# Patient Record
Sex: Male | Born: 1945 | Hispanic: No | Marital: Single | Smoking: Former smoker
Health system: Southern US, Community
[De-identification: ages and names within clinical notes are randomized; demographics above are authoritative.]

## PROBLEM LIST (undated history)

## (undated) DIAGNOSIS — I499 Cardiac arrhythmia, unspecified: Secondary | ICD-10-CM

## (undated) DIAGNOSIS — I2510 Atherosclerotic heart disease of native coronary artery without angina pectoris: Secondary | ICD-10-CM

## (undated) DIAGNOSIS — J189 Pneumonia, unspecified organism: Secondary | ICD-10-CM

## (undated) DIAGNOSIS — J40 Bronchitis, not specified as acute or chronic: Secondary | ICD-10-CM

## (undated) DIAGNOSIS — B029 Zoster without complications: Secondary | ICD-10-CM

## (undated) DIAGNOSIS — I422 Other hypertrophic cardiomyopathy: Secondary | ICD-10-CM

## (undated) DIAGNOSIS — E785 Hyperlipidemia, unspecified: Secondary | ICD-10-CM

## (undated) DIAGNOSIS — I10 Essential (primary) hypertension: Secondary | ICD-10-CM

## (undated) DIAGNOSIS — K219 Gastro-esophageal reflux disease without esophagitis: Secondary | ICD-10-CM

## (undated) HISTORY — DX: Zoster without complications: B02.9

## (undated) HISTORY — DX: Gastro-esophageal reflux disease without esophagitis: K21.9

## (undated) HISTORY — DX: Pneumonia, unspecified organism: J18.9

## (undated) HISTORY — DX: Bronchitis, not specified as acute or chronic: J40

---

## 1980-01-08 HISTORY — PX: APPENDECTOMY: SHX54

## 1997-08-22 ENCOUNTER — Encounter: Admission: RE | Admit: 1997-08-22 | Discharge: 1997-11-20 | Payer: Self-pay | Admitting: Family Medicine

## 1997-09-05 ENCOUNTER — Ambulatory Visit (HOSPITAL_COMMUNITY): Admission: RE | Admit: 1997-09-05 | Discharge: 1997-09-05 | Payer: Self-pay | Admitting: Interventional Cardiology

## 2001-03-17 ENCOUNTER — Other Ambulatory Visit: Admission: RE | Admit: 2001-03-17 | Discharge: 2001-03-17 | Payer: Self-pay | Admitting: Urology

## 2002-01-28 ENCOUNTER — Encounter: Admission: RE | Admit: 2002-01-28 | Discharge: 2002-01-28 | Payer: Self-pay | Admitting: Family Medicine

## 2002-01-28 ENCOUNTER — Encounter: Payer: Self-pay | Admitting: Family Medicine

## 2005-05-08 ENCOUNTER — Encounter: Payer: Self-pay | Admitting: Interventional Cardiology

## 2007-07-21 ENCOUNTER — Emergency Department (HOSPITAL_COMMUNITY): Admission: EM | Admit: 2007-07-21 | Discharge: 2007-07-21 | Payer: Self-pay | Admitting: Emergency Medicine

## 2007-10-23 ENCOUNTER — Inpatient Hospital Stay (HOSPITAL_COMMUNITY): Admission: EM | Admit: 2007-10-23 | Discharge: 2007-10-26 | Payer: Self-pay | Admitting: Emergency Medicine

## 2007-10-23 ENCOUNTER — Ambulatory Visit: Payer: Self-pay | Admitting: Internal Medicine

## 2007-10-26 ENCOUNTER — Encounter (INDEPENDENT_AMBULATORY_CARE_PROVIDER_SITE_OTHER): Payer: Self-pay | Admitting: *Deleted

## 2007-11-10 ENCOUNTER — Ambulatory Visit: Payer: Self-pay | Admitting: Internal Medicine

## 2007-11-10 ENCOUNTER — Telehealth (INDEPENDENT_AMBULATORY_CARE_PROVIDER_SITE_OTHER): Payer: Self-pay | Admitting: *Deleted

## 2007-11-10 DIAGNOSIS — I10 Essential (primary) hypertension: Secondary | ICD-10-CM | POA: Insufficient documentation

## 2007-11-10 DIAGNOSIS — J209 Acute bronchitis, unspecified: Secondary | ICD-10-CM

## 2007-11-12 ENCOUNTER — Telehealth (INDEPENDENT_AMBULATORY_CARE_PROVIDER_SITE_OTHER): Payer: Self-pay | Admitting: *Deleted

## 2007-11-12 ENCOUNTER — Emergency Department (HOSPITAL_COMMUNITY): Admission: EM | Admit: 2007-11-12 | Discharge: 2007-11-12 | Payer: Self-pay | Admitting: Emergency Medicine

## 2007-11-12 DIAGNOSIS — J984 Other disorders of lung: Secondary | ICD-10-CM | POA: Insufficient documentation

## 2007-11-13 ENCOUNTER — Telehealth (INDEPENDENT_AMBULATORY_CARE_PROVIDER_SITE_OTHER): Payer: Self-pay | Admitting: *Deleted

## 2007-11-16 ENCOUNTER — Ambulatory Visit: Payer: Self-pay | Admitting: Internal Medicine

## 2007-11-25 ENCOUNTER — Telehealth: Payer: Self-pay | Admitting: Internal Medicine

## 2007-12-11 ENCOUNTER — Telehealth (INDEPENDENT_AMBULATORY_CARE_PROVIDER_SITE_OTHER): Payer: Self-pay | Admitting: *Deleted

## 2007-12-17 ENCOUNTER — Telehealth (INDEPENDENT_AMBULATORY_CARE_PROVIDER_SITE_OTHER): Payer: Self-pay | Admitting: *Deleted

## 2008-01-04 ENCOUNTER — Telehealth: Payer: Self-pay | Admitting: Internal Medicine

## 2008-01-15 ENCOUNTER — Ambulatory Visit: Payer: Self-pay | Admitting: Internal Medicine

## 2008-01-15 DIAGNOSIS — R079 Chest pain, unspecified: Secondary | ICD-10-CM

## 2008-01-20 ENCOUNTER — Ambulatory Visit: Payer: Self-pay | Admitting: Cardiology

## 2008-01-25 ENCOUNTER — Telehealth (INDEPENDENT_AMBULATORY_CARE_PROVIDER_SITE_OTHER): Payer: Self-pay | Admitting: *Deleted

## 2008-01-27 ENCOUNTER — Ambulatory Visit: Payer: Self-pay

## 2008-02-09 ENCOUNTER — Telehealth (INDEPENDENT_AMBULATORY_CARE_PROVIDER_SITE_OTHER): Payer: Self-pay | Admitting: *Deleted

## 2008-02-11 ENCOUNTER — Telehealth (INDEPENDENT_AMBULATORY_CARE_PROVIDER_SITE_OTHER): Payer: Self-pay | Admitting: *Deleted

## 2008-02-12 ENCOUNTER — Ambulatory Visit: Payer: Self-pay | Admitting: Cardiology

## 2008-02-22 ENCOUNTER — Ambulatory Visit: Payer: Self-pay | Admitting: Internal Medicine

## 2008-02-23 ENCOUNTER — Telehealth: Payer: Self-pay | Admitting: Internal Medicine

## 2008-03-01 ENCOUNTER — Telehealth: Payer: Self-pay | Admitting: Internal Medicine

## 2008-03-06 DIAGNOSIS — K219 Gastro-esophageal reflux disease without esophagitis: Secondary | ICD-10-CM

## 2008-03-24 ENCOUNTER — Telehealth: Payer: Self-pay | Admitting: Internal Medicine

## 2008-03-31 ENCOUNTER — Telehealth: Payer: Self-pay | Admitting: Internal Medicine

## 2008-04-04 ENCOUNTER — Telehealth (INDEPENDENT_AMBULATORY_CARE_PROVIDER_SITE_OTHER): Payer: Self-pay | Admitting: *Deleted

## 2008-10-05 ENCOUNTER — Encounter: Admission: RE | Admit: 2008-10-05 | Discharge: 2008-10-05 | Payer: Self-pay | Admitting: Neurology

## 2008-10-11 ENCOUNTER — Ambulatory Visit: Payer: Self-pay | Admitting: Internal Medicine

## 2009-03-18 ENCOUNTER — Emergency Department (HOSPITAL_BASED_OUTPATIENT_CLINIC_OR_DEPARTMENT_OTHER): Admission: EM | Admit: 2009-03-18 | Discharge: 2009-03-18 | Payer: Self-pay | Admitting: Emergency Medicine

## 2009-04-11 ENCOUNTER — Ambulatory Visit: Payer: Self-pay | Admitting: Internal Medicine

## 2009-04-11 DIAGNOSIS — J45909 Unspecified asthma, uncomplicated: Secondary | ICD-10-CM | POA: Insufficient documentation

## 2009-09-07 ENCOUNTER — Ambulatory Visit: Payer: Self-pay | Admitting: Diagnostic Radiology

## 2009-09-07 ENCOUNTER — Emergency Department (HOSPITAL_BASED_OUTPATIENT_CLINIC_OR_DEPARTMENT_OTHER): Admission: EM | Admit: 2009-09-07 | Discharge: 2009-09-07 | Payer: Self-pay | Admitting: Emergency Medicine

## 2009-09-12 ENCOUNTER — Emergency Department (HOSPITAL_BASED_OUTPATIENT_CLINIC_OR_DEPARTMENT_OTHER): Admission: EM | Admit: 2009-09-12 | Discharge: 2009-09-12 | Payer: Self-pay | Admitting: Emergency Medicine

## 2009-11-10 ENCOUNTER — Emergency Department (HOSPITAL_BASED_OUTPATIENT_CLINIC_OR_DEPARTMENT_OTHER): Admission: EM | Admit: 2009-11-10 | Discharge: 2009-11-10 | Payer: Self-pay | Admitting: Emergency Medicine

## 2009-11-10 ENCOUNTER — Ambulatory Visit: Payer: Self-pay | Admitting: Diagnostic Radiology

## 2009-11-23 IMAGING — CR DG CHEST 2V
3 series · 3 of 3 positions shown · non-contrast
Comparison: None

CLINICAL DATA: Short of breath.  Cough.  Asthma.

CHEST - 2 VIEW

[w chest pa]
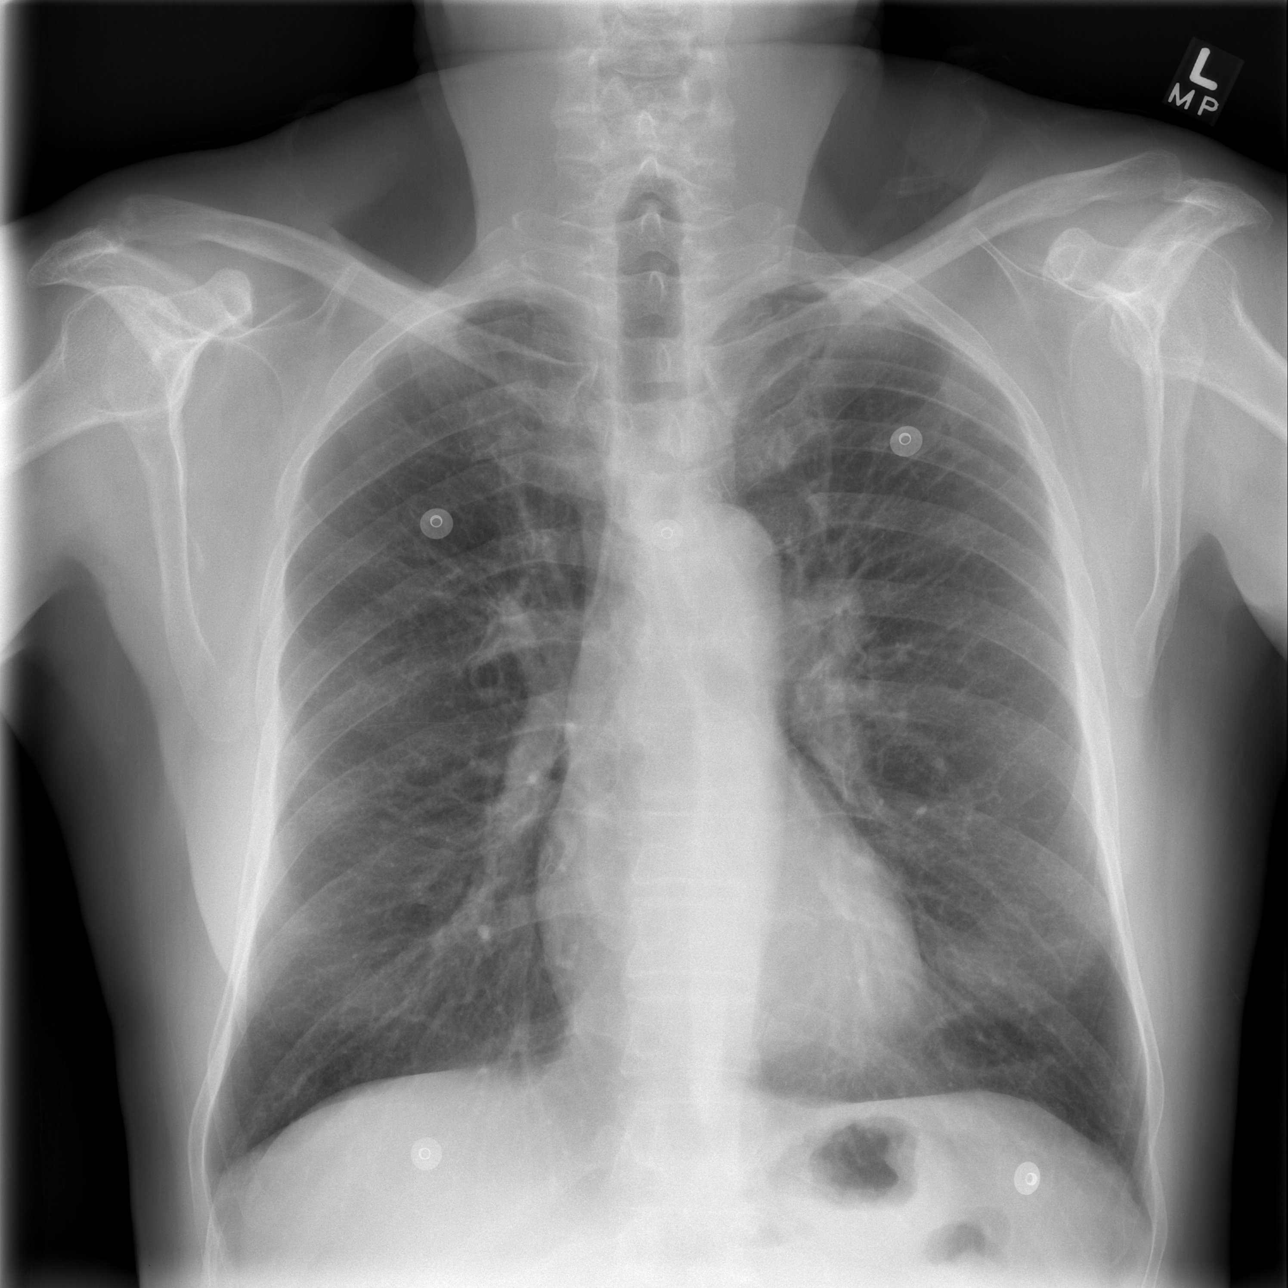

[w chest lat (1 of 2)]
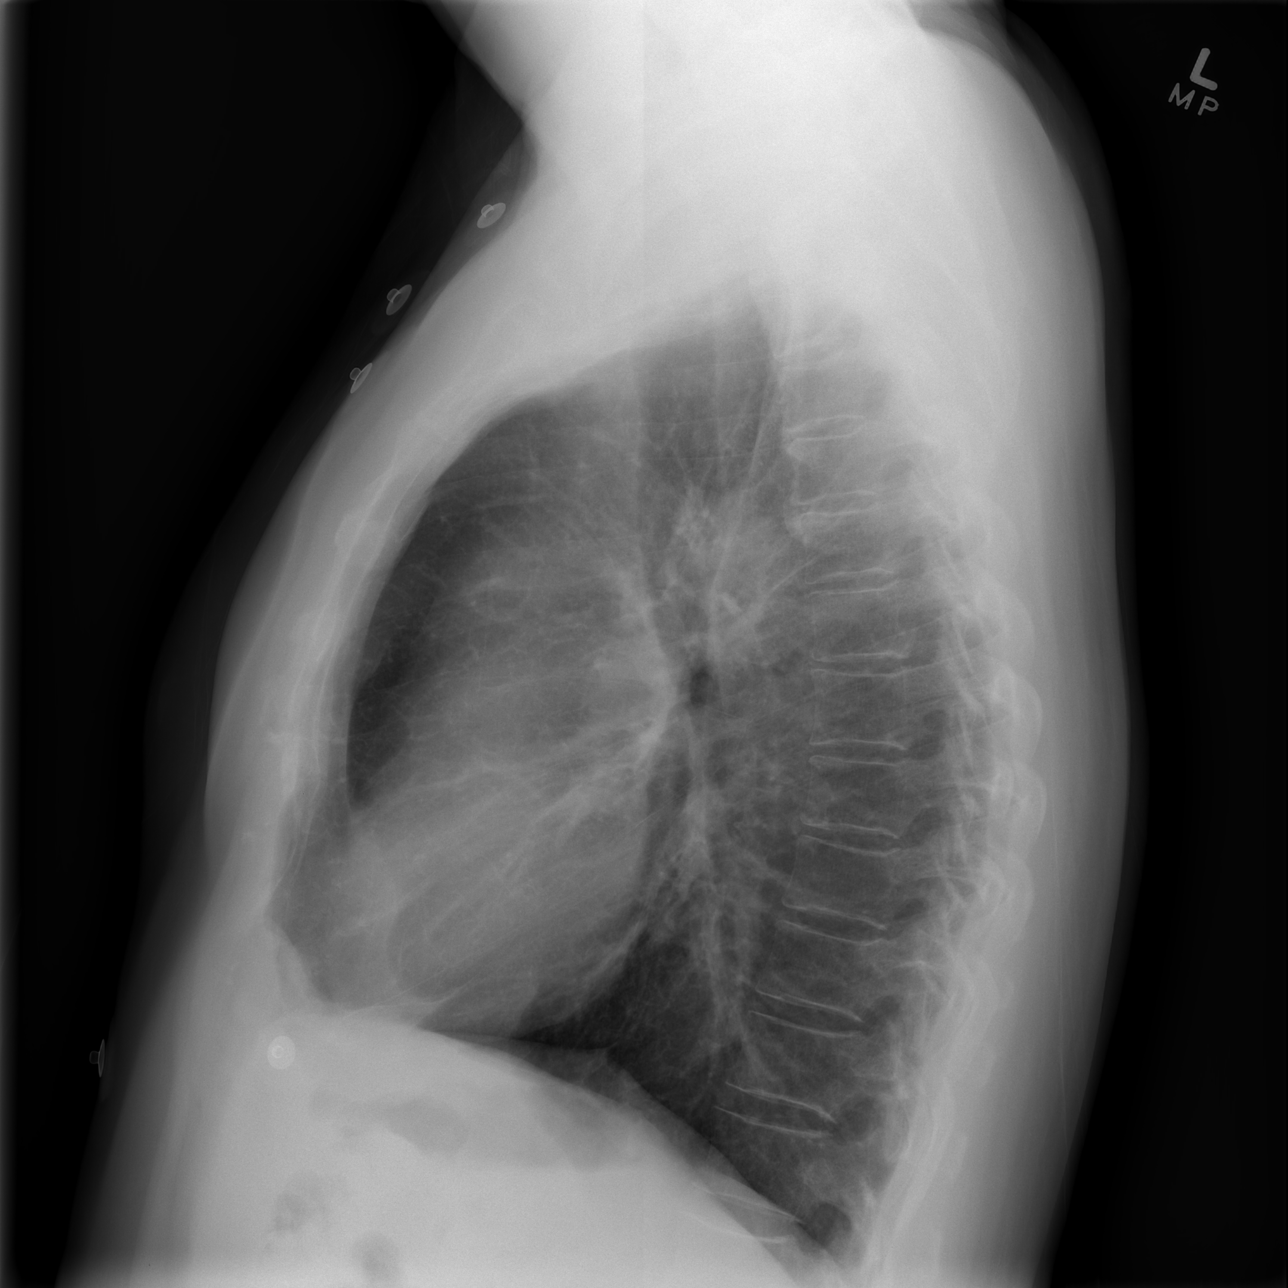

[w chest lat (2 of 2)]
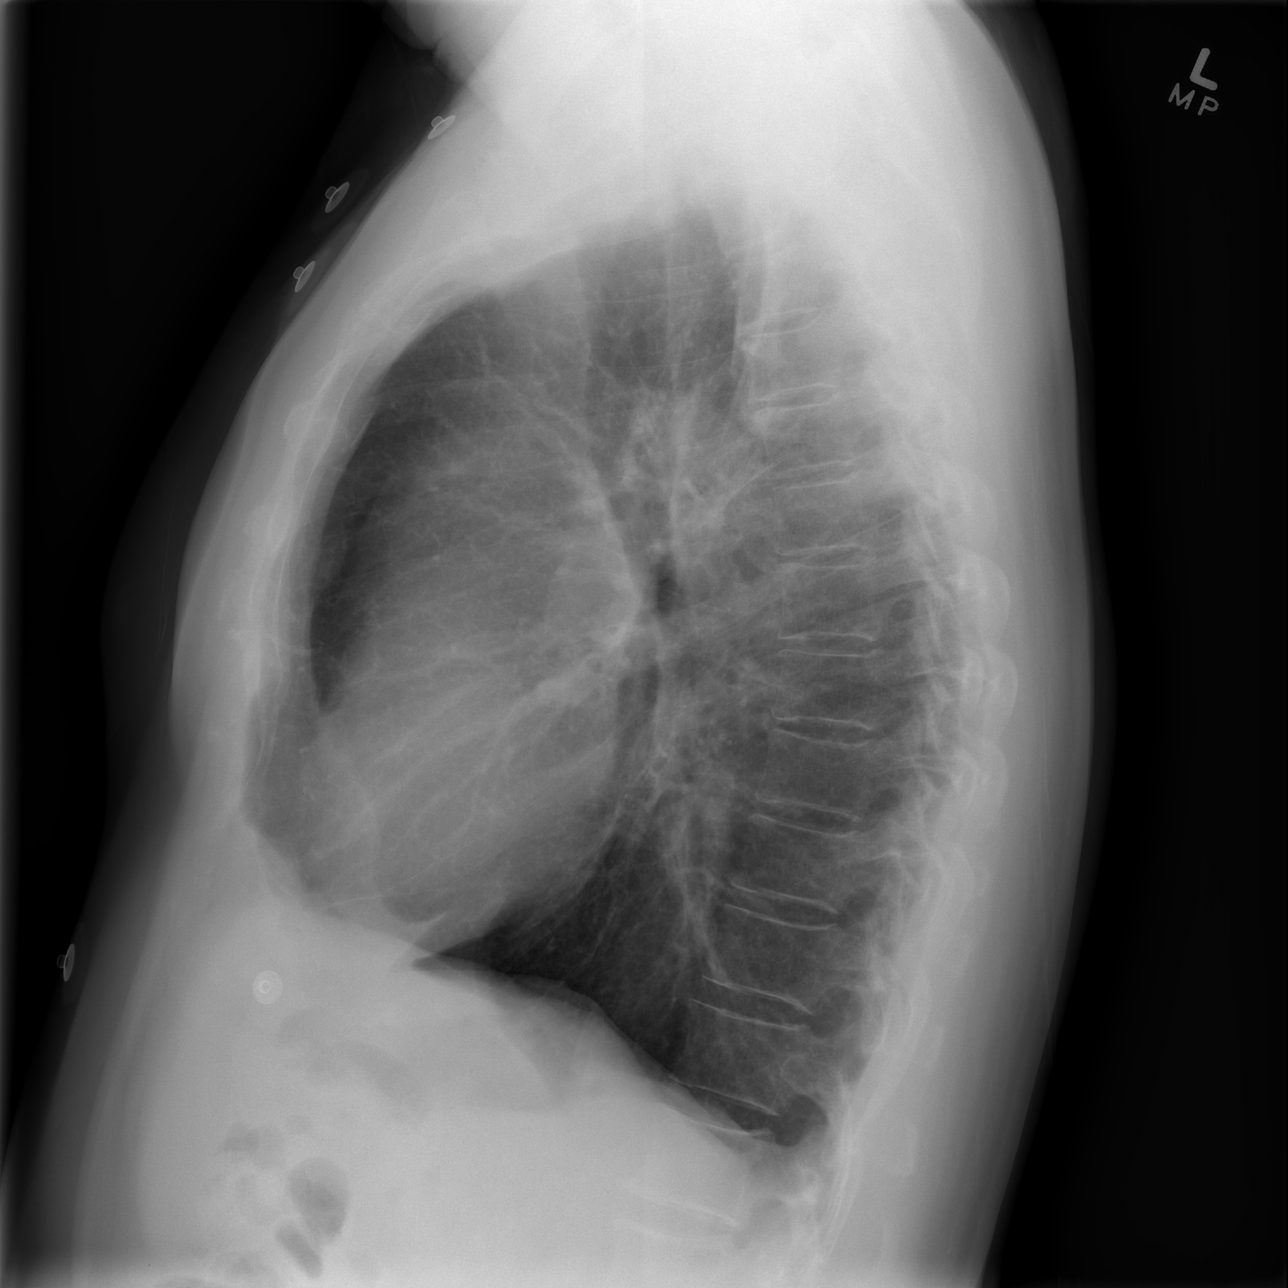

[3 of 3 positions shown; findings below may reference images not displayed]

FINDINGS: Normal cardiomediastinal silhouette.  Hyperaeration of
the lungs with diaphragmatic depression.  No active infiltrate or
atelectasis.
IMPRESSION: COPD/emphysema.  Negative for pneumonia.

## 2009-12-08 ENCOUNTER — Telehealth: Payer: Self-pay | Admitting: Internal Medicine

## 2010-01-16 ENCOUNTER — Telehealth (INDEPENDENT_AMBULATORY_CARE_PROVIDER_SITE_OTHER): Payer: Self-pay | Admitting: *Deleted

## 2010-01-18 ENCOUNTER — Ambulatory Visit
Admission: RE | Admit: 2010-01-18 | Discharge: 2010-01-18 | Payer: Self-pay | Source: Home / Self Care | Attending: Internal Medicine | Admitting: Internal Medicine

## 2010-01-18 DIAGNOSIS — H1010 Acute atopic conjunctivitis, unspecified eye: Secondary | ICD-10-CM | POA: Insufficient documentation

## 2010-01-18 DIAGNOSIS — J018 Other acute sinusitis: Secondary | ICD-10-CM | POA: Insufficient documentation

## 2010-01-28 ENCOUNTER — Encounter: Payer: Self-pay | Admitting: Internal Medicine

## 2010-02-08 NOTE — Assessment & Plan Note (Signed)
Summary: rov 6 months///kp   Primary Provider/Referring Provider:  Wm Ameen  CC:  follow up visit-increased throat cleasring and itchy eyes.Marland Kitchen  History of Present Illness: 11/16/07- Asthmatic bronchitis, LUL scarring Began coughing after LOV. He ?'d rxn to flu vax. Burning substernal precedes cugh, usually at night, worse lying down. Discussed reflux. 3 days ago back to ER, 1 day after flu vax.Felt better as soon as they applied O2. Coughed up mucus plugs- green brown. ER rx'd pred 20. OK overnight at home. has HOB up, 4 pillows, protonix. We called pred Nov 5 for 10 mg daily maint. Steroid talk. Reflux precautions talk.Rosalyn Gess helps but expensive.  02/22/08- Asthmatic bronchitis, LUL scarring Much wheezing - wakes him. Prefers Rosalyn Gess which lasts longer and allows sleep through night. Has more morning phlegm when he sleeps longer. Pulmicort also works but expensive. He saw Dr Antoine Poche and was prescribed Norvasc and chrlorthalidone. He has not been taking BP meds- he decided they might be too strong so he didn't try them.  October 11, 2008- Asthmatic bronchitis, LLL scarring He was last here in February. Got flu shot last week and thought it raised his BP but ok now.  He ran out of Brovana months ago, and out of Pulmicort several weeks ago. In the past week he has noted a little wheeze- weather change. Little cough or phlegm. Had ENT and neurology eval negative, to work up headache and dizziness.   April 11, 2009- Asthmatic bronchitis, LLL scarring No significant problems since last here. He is doing a lot better than a few years ago. He notes dyspnea trying to play basketball with his son in driveway. Minor chest clearing but no sudden changes, sustained cough, chest pain, palpitation. Has not needed nebulizer or his rescue inhaler. He continues Pulmicort 2 puffs, once daily. It is not formula on his insurance. Minor itching eyes. We discussed antihistamines.      Current Medications  (verified): 1)  Brovana 15 Mcg/15ml Nebu (Arformoterol Tartrate) .Marland Kitchen.. 1 Neb Twice Daily If Needed 2)  Pulmicort Flexhaler 180 Mcg/act Aepb (Budesonide) .... 2 Puffs and Rinse Twice Daily 3)  Proair Hfa 108 (90 Base) Mcg/act Aers (Albuterol Sulfate) .... 2 Puffs Four Times A Day As Needed Rescue Inhaler  Allergies (verified): No Known Drug Allergies  Past History:  Past Medical History: Last updated: 02/22/2008 Pneumonia- teenager GERD Asthma Bronchitis- Normal PFT 01/15/08 Shingles  Past Surgical History: Last updated: 02/22/2008 Appendectomy  Family History: Last updated: 01/28/08 Son- asthma Father: deceased age 67 - [place] Mother:  deceased age 83 2 sisters alive 1 brother alive  Siblings:  Social History: Last updated: 02/22/2008 pt is married.Originally from [place]. son 59 yrs old. Patient states former smoker. 10 year smoking hx ended about 30 years ago. 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   Relative is pulmonologist in [facility] [place], [phone] Applying Medicaid end November 09 Unemployed.  Risk Factors: Smoking Status: quit (11/10/2007)  Review of Systems      See HPI  The patient denies anorexia, fever, weight loss, weight gain, vision loss, decreased hearing, hoarseness, chest pain, syncope, dyspnea on exertion, peripheral edema, prolonged cough, headaches, hemoptysis, and severe indigestion/heartburn.    Vital Signs:  Patient profile:   65 year old male Height:      68 inches Weight:      164.38 pounds BMI:     25.08 O2 Sat:      99 % on Room air Pulse rate:  70 / minute BP sitting:   122 / 80  (left arm) Cuff size:   regular  Vitals Entered By: Reynaldo Minium CMA (April 11, 2009 9:18 AM)  O2 Flow:  Room air  Physical Exam  Additional Exam:  General: A/Ox3; pleasant and cooperative, NAD, medium build, comfortable appearing SKIN: no rash, lesions NODES: no lymphadenopathy HEENT: [place]/AT, EOM- WNL,  Conjuctivae- clear, PERRLA, TM-WNL, Nose- clear, Throat- clear and wnl, NECK: Supple w/ fair ROM, JVD- none, normal carotid impulses w/o bruits Thyroid-  CHEST:Very clear to P&A  HEART: RRR, no m/g/r heard ABDOMEN: Soft and nl; JXB:JYNW, nl pulses, no edema  NEURO: Grossly intact to observation      Impression & Recommendations:  Problem # 1:  ASTHMA (ICD-493.90) He is doing  very much better, probably due to Pulmicort and resolution of a post viral bronchitis pattern of a few years ago. We will refill his meds, changing pulmicort to flovent . He can use an otc antihistamine for as needed rhinitis and seasonal allergy. He is encouraged to keep up with his primary office for general care. We discussed lack of recent cxr and felt there was no direct indication for now.  Medications Added to Medication List This Visit: 1)  Flovent Hfa 110 Mcg/act Aero (Fluticasone propionate  hfa) .... 2 puffs and rinse mouth, twice daily  Other Orders: Est. Patient Level III (29562)  Patient Instructions: 1)  Please schedule a follow-up appointment in 1 year. 2)  Refill scripts for flovent 110 to use as a steroid inhaler, 2 puffs once daily, instead of Pulmicort. Also for Proair to use if needed as  a quick rescue inhaler. 3)  OTC antihistamines like claritin/ loratadine, allegra/ fexofenadine are nonsedating and can be taken as needed for allergy/ itching, sneezing. Prescriptions: FLOVENT HFA 110 MCG/ACT AERO (FLUTICASONE PROPIONATE  HFA) 2 puffs and rinse mouth, twice daily  #1 x prn   Entered and Authorized by:   Waymon Budge MD   Signed by:   Waymon Budge MD on 04/11/2009   Method used:   Print then Give to Patient   RxID:   [id number] PROAIR HFA 108 (90 BASE) MCG/ACT AERS (ALBUTEROL SULFATE) 2 puffs four times a day as needed rescue inhaler  #1 x prn   Entered and Authorized by:   Waymon Budge MD   Signed by:   Waymon Budge MD on 04/11/2009   Method used:   Print then Give to  Patient   RxID:   [id number] PULMICORT FLEXHALER 180 MCG/ACT AEPB (BUDESONIDE) 2 puffs and rinse twice daily  #1 x prn   Entered and Authorized by:   Waymon Budge MD   Signed by:   Waymon Budge MD on 04/11/2009   Method used:   Print then Give to Patient   RxID:   [id number]

## 2010-02-08 NOTE — Assessment & Plan Note (Signed)
Summary: acute visit-SOB and wheezing/pt to be here at 845am//kcw   Primary Provider/Referring Provider:  Wm Ameen  CC:  Acute visit-Increased SOB and wheezing.Marland Kitchen  History of Present Illness:  October 11, 2008- Asthmatic bronchitis, LLL scarring He was last here in February. Got flu shot last week and thought it raised his BP but ok now.  He ran out of Brovana months ago, and out of Pulmicort several weeks ago. In the past week he has noted a little wheeze- weather change. Little cough or phlegm. Had ENT and neurology eval negative, to work up headache and dizziness.   April 11, 2009- Asthmatic bronchitis, LLL scarring No significant problems since last here. He is doing a lot better than a few years ago. He notes dyspnea trying to play basketball with his son in driveway. Minor chest clearing but no sudden changes, sustained cough, chest pain, palpitation. Has not needed nebulizer or his rescue inhaler. He continues Pulmicort 2 puffs, once daily. It is not formula on his insurance. Minor itching eyes. We discussed antihistamines.  January 18, 2010 Asthmatic bronchitis, LLL scarring Nurse-CC: Acute visit-Increased SOB and wheezing. In the last week has noted more dyspnea on stairs. Went to a [facility] office for knee pain and they noticed increased wheeze. They suggested he go back on Pulmicort he wasn't using. Substernal burning and a little mucus, like when he had sustained bronchitis 2 years ago. Denies obvious infection, sore throat, fever etc. He denies gastric reflux/ heart burn. Has not had either pulmicort or Proair. Says he never used Advertising account planner when he had it. Had gone to [facility] 3 moths ago with headache relieved by antibiotic. Eyes burn water and itch. His eye doctor gave Genteal lubricant.      Asthma History    Initial Asthma Severity Rating:    Age range: 12+ years    Symptoms: >2 days/week; not daily    Nighttime Awakenings: 0-2/month    Interferes w/ normal activity: no  limitations    SABA use (not for EIB): 0-2 days/week    Asthma Severity Assessment: Mild Persistent   Preventive Screening-Counseling & Management  Alcohol-Tobacco     Smoking Status: quit  Current Medications (verified): 1)  Brovana 15 Mcg/53ml Nebu (Arformoterol Tartrate) .Marland Kitchen.. 1 Neb Twice Daily If Needed 2)  Proair Hfa 108 (90 Base) Mcg/act Aers (Albuterol Sulfate) .... 2 Puffs Four Times A Day As Needed Rescue Inhaler 3)  Pulmicort Flexhaler 180 Mcg/act Aepb (Budesonide) .... 2 Puffs Two Times A Day  Allergies (verified): No Known Drug Allergies  Past History:  Past Medical History: Last updated: 02/22/2008 Pneumonia- teenager GERD Asthma Bronchitis- Normal PFT 01/15/08 Shingles  Past Surgical History: Last updated: 02/22/2008 Appendectomy  Family History: Last updated: 01/20/08 Son- asthma Father: deceased age 16 - [place] Mother:  deceased age 53 2 sisters alive 1 brother alive  Siblings:  Social History: Last updated: 02/22/2008 pt is married.Originally from [place]. son 78 yrs old. Patient states former smoker. 10 year smoking hx ended about 30 years ago. 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   Relative is pulmonologist in [facility] [place], [phone] Applying Medicaid end November 09 Unemployed.  Risk Factors: Smoking Status: quit (01/18/2010)  Review of Systems      See HPI       The patient complains of shortness of breath with activity, non-productive cough, and nasal congestion/difficulty breathing through nose.  The patient denies shortness of breath at rest, productive cough, coughing  up blood, chest pain, irregular heartbeats, acid heartburn, indigestion, loss of appetite, weight change, abdominal pain, difficulty swallowing, sore throat, tooth/dental problems, headaches, and sneezing.    Vital Signs:  Patient profile:   65 year old male Height:      68 inches Weight:      171.50 pounds BMI:      26.17 Cuff size:   regular  Vitals Entered By: Reynaldo Minium CMA (January 18, 2010 8:59 AM)  O2 Flow:  Room air CC: Acute visit-Increased SOB and wheezing.   Physical Exam  Additional Exam:  General: A/Ox3; pleasant and cooperative, NAD, medium build, comfortable appearing SKIN: no rash, lesions NODES: no lymphadenopathy HEENT: [place]/AT, EOM- WNL, Conjuctivae- clear, PERRLA, TM-WNL, Nose- clear, Throat- clear and wnl, Mallampati  II NECK: Supple w/ fair ROM, JVD- none, normal carotid impulses w/o bruits Thyroid-  CHEST:No cough, wheeze or rhonchi  HEART: RRR, no m/g/r heard ABDOMEN: Soft and nl; WJX:BJYN, nl pulses, no edema  NEURO: Grossly intact to observation      Impression & Recommendations:  Problem # 1:  ASTHMATIC BRONCHITIS, ACUTE (ICD-466.0) He seems to be improving after a recent exacerbation . His medical understanding and interpretation of symptoms can be confusing. i considered but doubt reflux.  The following medications were removed from the medication list:    Flovent Hfa 110 Mcg/act Aero (Fluticasone propionate  hfa) .Marland Kitchen... 2 puffs and rinse mouth, twice daily His updated medication list for this problem includes:    Brovana 15 Mcg/36ml Nebu (Arformoterol tartrate) .Marland Kitchen... 1 neb twice daily if needed    Proair Hfa 108 (90 Base) Mcg/act Aers (Albuterol sulfate) .Marland Kitchen... 2 puffs four times a day as needed rescue inhaler    Pulmicort Flexhaler 180 Mcg/act Aepb (Budesonide) .Marland Kitchen... 2 puffs two times a day    Zithromax Z-pak 250 Mg Tabs (Azithromycin) .Marland Kitchen... 2 today then one daily for infection  Problem # 2:  RHINOSINUSITIS, ACUTE (ICD-461.8)  Recent sinusitis by his report of the xray results. I will give him augmentin script to hold.   His updated medication list for this problem includes:    Zithromax Z-pak 250 Mg Tabs (Azithromycin) .Marland Kitchen... 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 I don't  see obious irritation now and discussed dry eye. We will let him have sample antihistamine eye drop to try.  Medications Added to Medication List This Visit: 1)  Pulmicort Flexhaler 180 Mcg/act Aepb (Budesonide) .... 2 puffs two times a day 2)  Zithromax Z-pak 250 Mg Tabs (Azithromycin) .... 2 today then one daily for infection  Other Orders: Est. Patient Level III (82956)  Patient Instructions: 1)  Please schedule a follow-up appointment in 1 year. Please  call sooner as needed. 2)  Refill script for Proair rescue inhaler 3)  script and sample Pulmicort 180  2 puffs and rinse mouth, twice daily 4)  script for Z pak antibiotic to fill if needed 5)  sample Pataday antihistamine eyue drop for allergy if needed 6)     1 drop in each eye, once daily as needed.  7)  Ypou can try otc lubricant soothing eye medicine like Genteal if needed.  Prescriptions: PULMICORT FLEXHALER 180 MCG/ACT AEPB (BUDESONIDE) 2 puffs two times a day  #1 x 6   Entered and Authorized by:   Waymon Budge MD   Signed by:   Waymon Budge MD on 01/18/2010   Method used:  Print then Give to Patient   RxID:   [id number] PROAIR HFA 108 (90 BASE) MCG/ACT AERS (ALBUTEROL SULFATE) 2 puffs four times a day as needed rescue inhaler  #1 x 6   Entered and Authorized by:   Waymon Budge MD   Signed by:   Waymon Budge MD on 01/18/2010   Method used:   Print then Give to Patient   RxID:   [id number] ZITHROMAX Z-PAK 250 MG TABS (AZITHROMYCIN) 2 today then one daily for infection  #1 x prn   Entered and Authorized by:   Waymon Budge MD   Signed by:   Waymon Budge MD on 01/18/2010   Method used:   Print then Give to Patient   RxID:   [id number]

## 2010-02-08 NOTE — Progress Notes (Signed)
Summary: appointment  Phone Note Call from Patient   Caller: Patient Call For: dr Maple [name] Summary of Call: patient phoned he was seen at [facility] last Thursday or Friday he has alot of wheezing they wanted him to follow up with Dr Maple [name]. His first available appointment is not until Feb and he needs to be seen sooner than that. Patient can be reached [phone] Initial call taken by: Vedia Coffer,  January 16, 2010 2:05 PM  Follow-up for Phone Call        Pt c/o SOB with exertion (taking stairs), chest burning, wheezing, dry cough, headaches, PND, choking. Pt denies n/v/d. Not sure if he has a fever. Pt wants to be seen. Please advise. Thanks. Zackery Barefoot CMA  January 16, 2010 2:28 PM  [facility] [address]  Additional Follow-up for Phone Call Additional follow up Details #1::        Pt aware to come in Thursday at 845am.Katie [facility] CMA  January 16, 2010 5:09 PM

## 2010-02-08 NOTE — Progress Notes (Signed)
Summary: refill on proair  Phone Note Call from Patient Call back at [phone]   Caller: Patient Call For: young Reason for Call: Refill Medication Summary of Call: Requests refill on proair hfa 108.//[facility] Initial call taken by: Darletta Moll,  December 08, 2009 1:27 PM  Follow-up for Phone Call        1st rx sentto wrong pharmacy. Pt aware rx sent to [facility]. Carron Curie CMA  December 08, 2009 3:36 PM     Prescriptions: PROAIR HFA 108 (90 BASE) MCG/ACT AERS (ALBUTEROL SULFATE) 2 puffs four times a day as needed rescue inhaler  #1 x 6   Entered by:   Carron Curie CMA   Authorized by:   Waymon Budge MD   Signed by:   Carron Curie CMA on 12/08/2009   Method used:   Electronically to        [facility] [address] [id number]* (retail)       [address]       [place], [place]  [address]       Ph: [phone]       Fax: [phone]   RxID:   [id number] PROAIR HFA 108 (90 BASE) MCG/ACT AERS (ALBUTEROL SULFATE) 2 puffs four times a day as needed rescue inhaler  #1 x 6   Entered by:   Carron Curie CMA   Authorized by:   Waymon Budge MD   Signed by:   Carron Curie CMA on 12/08/2009   Method used:   Electronically to        [facility] [phone]* (retail)       [address]       [place], [place]  [address]       Ph: [phone]       Fax: [phone]   RxID:   [id number]

## 2010-03-22 LAB — DIFFERENTIAL
Lymphocytes Relative: 41 % (ref 12–46)
Neutro Abs: 3.7 10*3/uL (ref 1.7–7.7)
Neutrophils Relative %: 47 % (ref 43–77)

## 2010-03-22 LAB — POCT CARDIAC MARKERS: Myoglobin, poc: 45.6 ng/mL (ref 12–200)

## 2010-03-22 LAB — URINALYSIS, ROUTINE W REFLEX MICROSCOPIC
Bilirubin Urine: NEGATIVE
Ketones, ur: NEGATIVE mg/dL
Nitrite: NEGATIVE
Specific Gravity, Urine: 1.007 (ref 1.005–1.030)

## 2010-03-22 LAB — COMPREHENSIVE METABOLIC PANEL
ALT: 13 U/L (ref 0–53)
AST: 24 U/L (ref 0–37)
Alkaline Phosphatase: 93 U/L (ref 39–117)
CO2: 23 mEq/L (ref 19–32)
Calcium: 9.7 mg/dL (ref 8.4–10.5)
Creatinine, Ser: 1.1 mg/dL (ref 0.4–1.5)
GFR calc non Af Amer: >60 mL/min (ref >60)
Glucose, Bld: 95 mg/dL (ref 70–99)
Sodium: 142 mEq/L (ref 135–145)
Total Bilirubin: 0.7 mg/dL (ref 0.3–1.2)
Total Protein: 7.2 g/dL (ref 6.0–8.3)

## 2010-03-22 LAB — CBC
HCT: 45.7 % (ref 39.0–52.0)
MCH: 31.6 pg (ref 26.0–34.0)
MCV: 91 fL (ref 78.0–100.0)
Platelets: 147 10*3/uL — ABNORMAL LOW (ref 150–400)
RDW: 12 % (ref 11.5–15.5)

## 2010-03-22 LAB — WOUND CULTURE

## 2010-05-02 ENCOUNTER — Emergency Department (INDEPENDENT_AMBULATORY_CARE_PROVIDER_SITE_OTHER): Payer: Medicare Other

## 2010-05-02 ENCOUNTER — Emergency Department (HOSPITAL_BASED_OUTPATIENT_CLINIC_OR_DEPARTMENT_OTHER)
Admission: EM | Admit: 2010-05-02 | Discharge: 2010-05-02 | Disposition: A | Discharge status: Home / Self Care | Payer: Medicare Other | Attending: Emergency Medicine | Admitting: Emergency Medicine

## 2010-05-02 DIAGNOSIS — Y92009 Unspecified place in unspecified non-institutional (private) residence as the place of occurrence of the external cause: Secondary | ICD-10-CM | POA: Insufficient documentation

## 2010-05-02 DIAGNOSIS — M542 Cervicalgia: Secondary | ICD-10-CM

## 2010-05-02 DIAGNOSIS — IMO0002 Reserved for concepts with insufficient information to code with codable children: Secondary | ICD-10-CM | POA: Insufficient documentation

## 2010-05-02 DIAGNOSIS — J45909 Unspecified asthma, uncomplicated: Secondary | ICD-10-CM | POA: Insufficient documentation

## 2010-05-02 DIAGNOSIS — M25519 Pain in unspecified shoulder: Secondary | ICD-10-CM

## 2010-05-02 DIAGNOSIS — X58XXXA Exposure to other specified factors, initial encounter: Secondary | ICD-10-CM | POA: Insufficient documentation

## 2010-05-22 NOTE — Discharge Summary (Signed)
Anthony Sanford, Anthony Sanford                ACCOUNT NO.:  [id number]   MEDICAL RECORD NO.:  [id number]          PATIENT TYPE:  INP   LOCATION:  1403                         FACILITY:  [facility]   PHYSICIAN:  Hind I Elsaid, MD      DATE OF BIRTH:  December 20, 1945   DATE OF ADMISSION:  10/23/2007  DATE OF DISCHARGE:  10/26/2007                               DISCHARGE SUMMARY   DISCHARGE DIAGNOSES:  1. Acute asthmatic bronchitic exacerbation.  2. Chronic mild asthmatic bronchitis.  3. 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  4. Hypertension.   DISCHARGE MEDICATIONS:  1. Norvasc 10 mg p.o. daily.  2. Albuterol MDI 1 puff q.6h. p.r.n.  3. Pulmicort 0.5 mg inhaler twice daily.  4. Protonix 40 mg daily.  5. Prednisone, tapered dose.  6. Mucinex 600 mg twice daily.   PROCEDURE:  1. Chest x-ray:  COPD and emphysema.  2. CT, head, without contrast.  No acute intracranial abnormality.  3. CT angio, negative for pulmonary embolus.  Hyperinflation.  Left      upper lobe scarring with associated traction bronchiectasis.  Left      upper lobe scarring with associated traction bronchiectasis and      distal opacification, likely from old granulomatous disease.  Small      hiatal hernia with patchy gastroesophageal junction.  4. Barium swallow:  Small hiatal hernia, with bowel reflux/stricture,      oral esophagitis.   CONSULTATIONS:  Pulmonary consulted, done by Dr. Jetty Duhamel.   HOSPITAL COURSE:  This is a 65 year old male admitted to the hospital  with persistent cough, pleuritic shortness of breath, and wheezing.  The  patient denies any history of COPD.  He is admitted for evaluation of  his shortness of breath.  Chest x-ray and CT scan, as above.  Possible  alpha-1 antitrypsin deficiency was ruled out.  CT scan ruled out  pulmonary embolus.  Chest x-ray ruled out pneumonia.  Dr. Maple [name] felt it  secondary to acute asthma exacerbation.  Recommend rule out silent  aspiration by formal barium  swallow, which was done.  It did not show  any evidence of aspiration other than a small hiatal hernia.  Speech and  swallow:  No signs or symptoms of aspiration with p.o.  Patient will be  discharged with Protonix and reflux precautions.  Patient needs to  follow up with Dr. Jetty Duhamel within 2-3 weeks for possible pulmonary  function tests.   At this time, we felt that the patient is medically stable for  discharge.  Also, the patient had a 2D echo done during the hospital  stay.  The results are pending.  He can follow up the results of the  echo with his primary care physician.      Hind Bosie Helper, MD  Electronically Signed     HIE/MEDQ  D:  10/26/2007  T:  10/26/2007  Job:  [id number]

## 2010-05-22 NOTE — Assessment & Plan Note (Signed)
[facility]                            CARDIOLOGY OFFICE NOTE   NAME:Anthony Sanford                         MRN:          [id number]  DATE:02/12/2008                            DOB:          03/02/1945    REASON FOR PRESENTATION:  Evaluate the patient with dyspnea.   HISTORY OF PRESENT ILLNESS:  I saw the patient on January 20, 2008, for  evaluation of this.  He had been following closely with Dr. Maple Sanford.  He  since had increased mucus production, cough, and dyspnea.  He was  started on some doxycycline recently.  He is on Mucinex.  Unfortunately,  he has not had much improvement.  I did send him for stress perfusion  study because of his complaints of progressive dyspnea his ejection  fraction was found to be 57%.  There is no evidence of scar or ischemia.  However, a very hypertensive blood pressure response.  His blood  pressure shot up to 214 systolic with the second stage with the early  third stage of exercise.  He was having some chest tightness with this  as well.  His oxygen saturations were fine.   Because of this, I did want to bring him back to discuss treatment for  his hypertension.  He has had elevated blood pressure readings in the  office x2.  He is very reluctant to take any medications.  However, he  does not drink.  He does not use a lot of salt.  He is not particularly  overweight.  Therefore, therapeutic lifestyle changes may not make a big  difference.  He has had no new chest discomfort, but is still bothered  by the respiratory complaints as above.   PAST MEDICAL HISTORY:  Borderline hypertension, asthmatic bronchitis,  and appendectomy.   ALLERGIES:  None.   MEDICATIONS:  1. Norvasc 10 mg daily (the patient was supposed to be taking this but      has not been).  2. Protonix (not taking this).  3. Albuterol nebulizer.  4. Brovana.  5. Mycelex.  6. Doxycycline.   REVIEW OF SYSTEMS:  As stated in the HPI, and otherwise  negative for  other systems.   PHYSICAL EXAMINATION:  GENERAL:  He looks fatigue, but he is in no acute  distress.  VITAL SIGNS:  Blood pressure 142/82, heart rate 82 and regular, weight  169 pounds, body mass index 24.  HEENT:  Eyes unremarkable; pupils equal, round, and reactive to light;  fundi not visualized.  NECK:  No jugular venous distension at 45 degrees, carotid upstroke  brisk and symmetrical.  No bruits.  No thyromegaly.  LYMPHATICS:  No adenopathy.  LUNGS:  Clear to auscultation bilaterally.  BACK:  No costovertebral angle tenderness.  CHEST:  Unremarkable.  HEART:  PMI not displaced or sustained.  S1 and S2 within normal limits.  No S3, no S4.  No clicks, no rubs, no murmurs.  ABDOMEN:  Flat, positive bowel sounds, normal in frequency and pitch.  No bruits, no rebound, no guarding.  No midline pulsatile mass, no  hepatomegaly, no  splenomegaly.  SKIN:  No rashes, no nodules.  EXTREMITIES:  Pulses 2+, no edema.   ASSESSMENT AND PLAN:  1. Dyspnea.  I think the patient's dyspnea is primary pulmonary      problem.  He does have a rhonchi on his lungs exam with few      expiratory wheezes.  Given this, I have sent a flag to Dr. Jetty Duhamel asking if he will see the patient sooner than the 3 months.      He is quite uncomfortable and I think it is important that he      continue to have this addressed.  2. Hypertension.  I will try to encourage him to take      antihypertensive.  I reviewed his meds in the past.  He was on the      Norvasc, but he is not taking this.  I reviewed his treadmill test      from several years ago when he had hypertensive blood pressure      response with the blood pressure 236 in stage II.  I have written      the prescription for chlorthalidone 12.5 mg daily.  He is told to      take increased potassium containing foods and we discussed these.      He is to get a BMET in about 10-14 days.  Hopefully, he will comply      with all of  this.  3. Followup will be with his primary care doctor and Dr. Maple Sanford.     Anthony Rotunda, MD, [facility]  Electronically Signed    JH/MedQ  DD: 02/12/2008  DT: 02/13/2008  Job #: [id number]   cc:   Anthony Fears D. Maple [name], MD, FCCP, FACP

## 2010-05-22 NOTE — H&P (Signed)
Anthony Sanford, Anthony Sanford                ACCOUNT NO.:  [id number]   MEDICAL RECORD NO.:  [id number]          PATIENT TYPE:  EMS   LOCATION:  ED                           FACILITY:  [facility]   PHYSICIAN:  Lucita Ferrara, MD         DATE OF BIRTH:  08/03/1945   DATE OF ADMISSION:  10/23/2007  DATE OF DISCHARGE:                              HISTORY & PHYSICAL   PRIMARY CARE DOCTOR:  Unassigned, the patient sees Dr. Nelson Chimes in a  [facility] [place].   The patient is a 65 year old male Caucasian who presents to [facility] with persistence of his cough, pleuritis shortness of  breath and wheeze.  The patient has a 74-month history of asthmatic  bronchitis which was diagnosed at his primary care office without any  pulmonary function tests.  The patient denies a history of COPD being on  home oxygen.  The patient's symptoms exacerbated about 3 days ago with  some sputum production and persistence of wheeze.  The patient then  presented to his primary care physician, Dr. Nelson Chimes and he received  steroid injection and inhalers which did not resolve his symptoms.  He  denies any fevers or chills, chest pain that is pressure-like or worse  upon exertion, lower extremity swelling, sweating, vomiting, diarrhea.  The patient came here to the emergency room and received several  treatments.  He did feel somewhat better, but upon ambulation he again,  became progressively worse as far as his shortness of breath.  Now  important to the history of present of illness says the patient is a  nonsmoker, but he does state that he has had a recent travel which was  about mid September to [place].  He however, denies any lower  extremity tightness, swelling.  Never has been diagnosed with a  pulmonary embolism.  He does not have a family history of lung disease.  Again, he denies any fevers or chills.   He does state that he does have persistent morning headache that has  been  ongoing for the last 6 months to 1 year.  The patient does work as  a Curator.  He does state that he has had exposure to exhaust and other  chemicals related to Banker and such.   The patient has also recently been diagnosed with hypertension and was  given Norvasc.  He denies a history of allergies to ACE inhibitors or  ACE induced cough and he has never heard of such a thing.   PAST MEDICAL HISTORY:  Significant for:  1. Asthma.  2. Shingles.  3. History of recently diagnosed hypertension.  Currently not listed      to take any medications.   SOCIAL HISTORY:  Denies drugs, alcohol or tobacco.  He works as a  Curator.   ALLERGIES:  No known drug allergies.   PAST SURGICAL HISTORY:  Denies.   MEDICATIONS AT HOME:  Include:  1. Albuterol 90 mcg per inhalation 1 puff every 4-6 hours as needed.  2. He also takes Hycodan  cough suppressant.   REVIEW OF SYSTEMS:  As per HPI, otherwise negative.   PHYSICAL EXAMINATION:  Generally speaking the patient is no acute  distress.  Blood pressure is 142/84, pulse 72, respirations 18, temperature 97  HEENT:  Normocephalic, atraumatic.  Sclerae is anicteric. PERLA.  Extraocular muscles intact.  NECK:  Supple.  No JVD, no carotid bruits.  CARDIOVASCULAR:  S1 and  S2, regular rate and rhythm.  No murmurs, rubs  or clicks.  LUNGS:  There is respiratory wheezes.  ABDOMEN:  Soft, nontender, nondistended.  Positive bowel sounds.  NEURO:  Patient is alert and oriented times three.  Cranial nerves II-  XII are grossly intact.  ABDOMEN:  Soft, nontender, nondistended.  Positive bowel sounds.Marland Kitchen   LABORATORY DATA:  The patient had a cardiac markers times one.  Negative  beta-natriuretic peptide less than 330.  His iSTAT shows a hemoglobin  17, hematocrit of 50.  Sodium 137, potassium 4, BUN 17, creatinine 1.2  and his CBC again, hemoglobin 17, hematocrit of 50 and his ABG shows pH  of 7.49/pCO2 of 29.8/pO2 of 66.7.  CT scan of the  head which was done  shows no acute intracranial abnormalities.  Chest x-ray shows COPD,  emphysema but there it is negative for pneumonia.   ASSESSMENT/PLAN:  A 65 year old with dyspnea, likely multifactorial in  etiology.  1. A chest x-ray shows a chronic obstructive pulmonary disease, yet      the patient has never been diagnosed with chronic obstructive      pulmonary disease or had pulmonary function tests.  Frankly, the      patient has low risk factors for chronic obstructive pulmonary      disease.  The patient is a nonsmoker.  The patient does have      exposure to chronic exhaust from being a Curator.  So we will need      to rule out occupational chronic lung disease, we will also need to      rule out alpha-1 antitrypsin deficiency.  Also, we will proceed      with ruling out a pulmonary embolism.  We will proceed with a CT      scan of the chest, alpha-1 antitrypsin level.  We will continue      with albuterol and Atrovent.  We will initiate steroids.  We will      monitor oxygen while ambulating and determine if the patient's need      for home oxygen which I doubt at this point.  2. Morning headaches and likely secondary to increased CO2 level at      night associated during times of hypoventilation and rapid eye      movement sleep.  If really the patient does have chronic      obstructive pulmonary disease we will need also need to evaluate      for obstructive sleep apnea.  This can be done as an outpatient by      the discerning his symptomatology.  In the emergency room they are      going to rule out carbon monoxide exposure.  3. Hypertension with moderate control.  Continue with Norvasc and      monitor and we will adjust medication as needed.  4. Deep vein thrombosis and gastrointestinal  prophylaxis with Lovenox      and Protonix.  5. The rest of the plans are dependent on his progress.  Note, the      patient will  need a pulmonology evaluation either here as  in      inpatient for follow-up as an outpatient appropriately.      Lucita Ferrara, MD  Electronically Signed     RR/MEDQ  D:  10/23/2007  T:  10/23/2007  Job:  [id number]

## 2010-05-22 NOTE — Consult Note (Signed)
Anthony Sanford, Anthony Sanford                ACCOUNT NO.:  [id number]   MEDICAL RECORD NO.:  [id number]          PATIENT TYPE:  INP   LOCATION:  1403                         FACILITY:  [facility]   PHYSICIAN:  Clinton D. Maple [name], MD, FCCP, FACPDATE OF BIRTH:  11-Jul-1945   DATE OF CONSULTATION:  10/24/2007  DATE OF DISCHARGE:                                 CONSULTATION   PROBLEM FOR CONSULTATION:  A 65 year old man admitted with wheezing and  dyspnea, recent exacerbation.  Abnormal chest CT.   HISTORY:  He had a pneumonia as a teenager in [place].  Several years later  visiting a friend in [place], [place], he had a chest x-ray  which showed an abnormality in the posterior left upper lung zone.  His  friend, a physician, recommended he stop smoking and the patient did  quit at that time.  He estimates a smoking history of age 41 to age 60  approximately.  He did not have a PPD skin test placed when he came to  this country from [place].  He does not know of tuberculosis exposure and  does not remember BCG vaccination.  His wife has told him over the last  several years that he wheezes sometimes at night.  He has treated  himself over-the-counter occasionally.  He had a worse exacerbation in  May of 2009 treated by Dr. Nolon Nations, his primary physician, with a  steroid injection, pills and an inhaler.  He remembers clearing pretty  completely then and noticed that the treatment also helped chronic left  shoulder pain.  He was concerned that he might get worse related to an  airplane flight to [place] last month.  Apparently he had some  awareness of respiratory discomfort at that time because he considered  going to see his cousin, a lung specialist in [place], but did not do  so.  There was no specific exacerbation with that flight.  He had been  told by an ear, nose and throat physician in the past that occasional  low-grade cough with phlegm was due to drainage but he had no evidence  of sinus disease on a sinus CT scan.  He woke several days prior to  admission with a rather pronounced worsening of wheezing and cough  thought I was going to cough my lungs out producing only white sputum.  Dr. Ivory Broad had told him that a chest x-ray and lab results were okay so  the patient decided to come to the emergency room for further  evaluation.   REVIEW OF SYSTEMS:  He denies significant nasal congestion, sore throat,  earache, headache, fever, purulent or bloody discharge, chest pain or  palpitations.  He is not overtly aware of either aspiration or  regurgitation.  He denies adenopathy and edema.   SOCIAL HISTORY:  He owns a Pharmacist, hospital which includes a  Nurse, learning disability.  He has noticed the odors associated with that shop are  occasionally irritating to his breathing.  A 10 year smoking history  ended about 30 years ago as noted.  Married.   PAST MEDICAL HISTORY:  Bronchitis, shingles, hypertension (not treated  with ACE inhibitor).   MEDICAL ALLERGIES:  None known.   FAMILY HISTORY:  Denies any family history of respiratory disease.   OBJECTIVE:  GENERAL:  A pleasant gentleman medium build, lightly  accented articulate speech, no apparent distress.  VITAL SIGNS:  BP 150/82, pulse regular and 91, respiration 22,  temperature 98.6, oxygen saturation 99% on 2 liter prongs.  SKIN:  No rash.  Adenopathy none found.  HEENT:  Oral mucosa normally hydrated.  No stridor or neck vein  distention.  CHEST:  Faint wheeze left apex posteriorly otherwise no wheeze, rhonchi,  dullness or rub.  Work of breathing not increased.  HEART:  Regular rhythm.  No murmur or gallop.  ABDOMEN:  Nontender.  No palpable organomegaly.  PELVIC/RECTAL:  Not done, noncontributory.   RADIOLOGY:  I reviewed his CT scan which shows some hyperinflation, left  upper lobe scarring with traction bronchiectasis and dystrophic  calcification consistent with old granulomatous disease, small  hiatal  hernia with a patulous gastroesophageal junction.  The radiologist  questioned left ventricular hypertrophy.   IMPRESSION:  1. Acute asthmatic bronchitic exacerbation.  In this context, waking      him suddenly from sleep, aspiration is a real possibility.  Suggest      formal barium swallow looking for positional reflux.  I do not get      a history strongly suggesting dysphagia or swallowing related      dysfunction but speech therapy assisted modified barium swallow can      also be considered.  2. Chronic mild asthmatic bronchitis probably going back several      years, best treated with routine bronchodilator and anti-      inflammatory therapy.  3. 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  He should have a PPD      placed.  4. He could probably be converted to oral steroids and      bronchodilators, educated in reflux precautions and sent to      outpatient followup fairly soon.  It may be possible to get a      barium swallow done here before discharge.  I have given him my      card and would be happy to follow with him as an outpatient if      needed.  He should also be encouraged to follow with Dr. Nolon Nations in [place], [place], for primary care.      Clinton D. Maple [name], MD, Tonny Bollman, FACP  Electronically Signed     CDY/MEDQ  D:  10/24/2007  T:  10/24/2007  Job:  [id number]   cc:   Dr Nolon Nations

## 2010-05-22 NOTE — Assessment & Plan Note (Signed)
[facility]                            CARDIOLOGY OFFICE NOTE   NAME:Anthony Sanford, Anthony Sanford                         MRN:          [id number]  DATE:01/20/2008                            DOB:          1945-10-14    REFERRING PHYSICIAN:  Clinton D. Maple [name], MD, FCCP, FACP   REASON FOR CONSULTATION:  Evaluate the patient with chest discomfort.   HISTORY OF PRESENT ILLNESS:  The patient is a pleasant 65 year old  gentleman originally from [place].  He has no past history of coronary  artery disease, though he did have chest discomfort and was evaluated  with a stress test in 2004.  This demonstrated no evidence of ischemia  or infarct.  His EF was 52%.  There was some diaphragmatic attenuation.  He was hospitalized in October with shortness of breath and was found to  have asthmatic bronchitis, has been managed by Dr. Maple [name] for this.  He  also has old granulomatous scarring in his left upper lobe.  When he was  recently seeing Dr. Maple [name], he describes chest discomfort.  This most  discomfort that happened about 3 months ago, when he was walking  briskly.  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  He would notice some burning  discomfort in his chest with exertion.  He does not think it had this  before the summer.  Interestingly, he was able to push through that  discomfort and keep going and symptoms would eventually go away.  It was  moderate in intensity.  There was not any associated nausea, vomiting,  or diaphoresis.  He did not have radiation to his neck or to his arms.  It seems to be substernal.  He does get short of breath and has had this  as a problem.  He has also had coughing.  He has audible wheezing.  He  has to take his bronchodilator every 6-7 hours where he gets short of  breath.  He was having some trouble sleeping and was actually sleeping  sitting up.  He started taking Pulmicort and very  recently this was  helped him to be able to sleep better in his bed.  He has been able to  lay flat recently and is not describing any PND or orthopnea.  He gets  some rare palpitations.  He has had no presyncope or syncope.  He has  had no swelling.   PAST MEDICAL HISTORY:  1. Borderline hypertension.  2. Asthmatic bronchitis.   PAST SURGICAL HISTORY:  Appendectomy.   ALLERGIES:  None.   MEDICATIONS:  1. Prednisone 5 mg daily.  2. Pulmicort.  3. Albuterol nebulizer.   SOCIAL HISTORY:  The patient is not married.  He has a 67 year old.  He  is retired.  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  He quits smoking  many many years ago.   FAMILY HISTORY:  Contributory for his father having myocardial  infarction in his 40s.   REVIEW OF SYSTEMS:  Positive for headaches  that he used to get every  morning, though this seemed to have stopped.  He has had occasional  discharge from his throat.  Otherwise, negative for all other systems.   PHYSICAL EXAMINATION:  GENERAL:  The patient is pleasant and in no  distress.  VITAL SIGNS:  Blood pressure 150/56 and heart rate 72 and regular.  HEENT:  Eyelids unremarkable; pupils equal, round, and reactive to  light; fundi within normal limits; oral mucosa unremarkable.  NECK:  No jugular venous distention at 45 degrees; carotid upstroke  brisk and symmetrical; no bruits, no thyromegaly.  LYMPHATICS:  No cervical, axillary, or inguinal adenopathy.  LUNGS:  Expiratory wheezing, no crackles.  BACK:  No costovertebral angle tenderness.  CHEST:  Unremarkable.  HEART:  PMI not displaced or sustained; S1 and S2 within normal limits;  no S3, no S4; no clicks, no rubs, no murmurs.  ABDOMEN:  Flat; positive bowel sounds; normal in frequency and pitch; no  bruits, no rebound, no guarding; no midline pulsatile mass; no  hepatomegaly; no splenomegaly.  SKIN:  No rashes.  No nodules.  EXTREMITIES:  Pulses 2+ throughout; no edema, no cyanosis, no  clubbing.  NEURO:  Oriented to person, place, and time; cranial nerves II through  XII grossly intact, motor grossly intact.   EKG; sinus rhythm, rate 72, axis within normal limits, intervals within  normal limits, and no acute ST-T wave changes.   ASSESSMENT AND PLAN:  1. Chest discomfort.  The patient's chest discomfort is worrisome for      exertional angina.  He has not been doing nearly as much activity      as he had been, so he has not been bringing on this discomfort.      There are some atypical features such as the fact that he can work      through the pain.  However, given at least a moderate pretest      probability of obstructive coronary artery disease, he needs to be      screened with his stress perfusion study.  He thinks he will be      able to walk on a treadmill and will do a Myoview imaging.  2. Hypertension.  Blood pressure is very slightly elevated.  However,      he checks it every night and it is in the 130s/80s.  I have asked      him to check this sporadically.  We will also see how his blood      pressure does on the treadmill.  For now, he does not need any      specific therapy.  3. Asthmatic bronchitis per Dr. Maple [name].  4. Dyslipidemia.  The patient had mildly elevated LDL of 121.      However, his HDL was 51.  At      this point, guidelines would not suggest statin therapy.  5. Followup will be based on the results of the above.     Rollene Rotunda, MD, [facility]  Electronically Signed    JH/MedQ  DD: 01/20/2008  DT: 01/21/2008  Job #: [id number]   cc:   Joni Fears D. Maple [name], MD, FCCP, FACP

## 2010-10-09 LAB — BASIC METABOLIC PANEL
BUN: 17
Creatinine, Ser: 1.14
GFR calc non Af Amer: >60
Glucose, Bld: 154 — ABNORMAL HIGH

## 2010-10-09 LAB — HEMOGLOBIN A1C
Hgb A1c MFr Bld: 5.8
Mean Plasma Glucose: 120

## 2010-10-09 LAB — BLOOD GAS, ARTERIAL
Acid-Base Excess: 0.7
Acid-base deficit: 0.7
Drawn by: 103701
Drawn by: 23588
O2 Content: 2
O2 Saturation: 95
O2 Saturation: 96.5
Patient temperature: 98.6
Patient temperature: 98.6
pCO2 arterial: 29.8 — ABNORMAL LOW
pO2, Arterial: 66.7 — ABNORMAL LOW
pO2, Arterial: 78.7 — ABNORMAL LOW

## 2010-10-09 LAB — COMPREHENSIVE METABOLIC PANEL
Albumin: 3.6
Alkaline Phosphatase: 76
BUN: 17
Calcium: 9.3
Potassium: 4
Total Protein: 6.2

## 2010-10-09 LAB — CBC
HCT: 44.7
HCT: 45.5
HCT: 50.3
Hemoglobin: 17.1 — ABNORMAL HIGH
MCHC: 33.5
MCHC: 33.9
MCV: 90.5
MCV: 91.4
Platelets: 173
Platelets: 174
Platelets: 178
RBC: 5.57
RDW: 12.7
RDW: 13
RDW: 13
WBC: 12.1 — ABNORMAL HIGH
WBC: 13.6 — ABNORMAL HIGH

## 2010-10-09 LAB — PROTIME-INR: Prothrombin Time: 13.3

## 2010-10-09 LAB — POCT I-STAT, CHEM 8
Creatinine, Ser: 1.2
Glucose, Bld: 133 — ABNORMAL HIGH
HCT: 50
Potassium: 4.1
Sodium: 137

## 2010-10-09 LAB — POCT CARDIAC MARKERS
CKMB, poc: 1.8
Myoglobin, poc: 84.1

## 2010-10-09 LAB — DIFFERENTIAL
Basophils Relative: 0
Eosinophils Relative: 4
Lymphocytes Relative: 23
Monocytes Absolute: 0.7

## 2010-10-09 LAB — CARDIAC PANEL(CRET KIN+CKTOT+MB+TROPI)
CK, MB: 1.8
CK, MB: 2.2
Relative Index: INVALID
Total CK: 52
Total CK: 91
Troponin I: 0.04
Troponin I: 0.05
Troponin I: 0.05

## 2010-10-09 LAB — APTT: aPTT: 24

## 2010-10-09 LAB — PHOSPHORUS: Phosphorus: 2.7

## 2010-10-09 LAB — MAGNESIUM: Magnesium: 2.3

## 2010-10-09 LAB — LIPID PANEL
Total CHOL/HDL Ratio: 3.7
VLDL: 19

## 2010-10-09 LAB — TSH: TSH: 0.354

## 2011-01-11 ENCOUNTER — Encounter: Payer: Self-pay | Admitting: Internal Medicine

## 2011-01-16 ENCOUNTER — Encounter: Payer: Self-pay | Admitting: Internal Medicine

## 2011-01-17 ENCOUNTER — Ambulatory Visit: Payer: No Typology Code available for payment source | Admitting: Internal Medicine

## 2011-02-06 ENCOUNTER — Encounter: Payer: No Typology Code available for payment source | Admitting: Internal Medicine

## 2011-02-06 ENCOUNTER — Ambulatory Visit: Payer: Medicare Other | Admitting: Internal Medicine

## 2011-12-16 ENCOUNTER — Encounter: Payer: Self-pay | Admitting: Gastroenterology

## 2011-12-18 ENCOUNTER — Encounter: Payer: Self-pay | Admitting: *Deleted

## 2011-12-24 ENCOUNTER — Ambulatory Visit: Payer: No Typology Code available for payment source | Admitting: Gastroenterology

## 2012-01-08 HISTORY — PX: OTHER SURGICAL HISTORY: SHX169

## 2012-06-13 DIAGNOSIS — M545 Low back pain, unspecified: Secondary | ICD-10-CM | POA: Insufficient documentation

## 2013-01-07 HISTORY — PX: FINGER SURGERY: SHX640

## 2013-08-19 DIAGNOSIS — N138 Other obstructive and reflux uropathy: Secondary | ICD-10-CM | POA: Insufficient documentation

## 2013-09-29 DIAGNOSIS — D691 Qualitative platelet defects: Secondary | ICD-10-CM | POA: Insufficient documentation

## 2013-09-29 DIAGNOSIS — E785 Hyperlipidemia, unspecified: Secondary | ICD-10-CM | POA: Insufficient documentation

## 2014-01-31 ENCOUNTER — Ambulatory Visit: Payer: No Typology Code available for payment source | Admitting: Internal Medicine

## 2014-01-31 ENCOUNTER — Institutional Professional Consult (permissible substitution): Payer: Medicare Other | Admitting: Internal Medicine

## 2014-01-31 ENCOUNTER — Telehealth: Payer: Self-pay | Admitting: Internal Medicine

## 2014-01-31 NOTE — Telephone Encounter (Signed)
Anthony Sanford- depends on how urgent this is. We can discuss.

## 2014-01-31 NOTE — Telephone Encounter (Signed)
Spoke with patient-states he can not go as long out as CY's next open consult appt. I have placed patient on CY's schedule for Thursday 02-10-14 at 11:15am. Nothing more needed at this time.

## 2014-01-31 NOTE — Telephone Encounter (Signed)
Dr. Annamaria Boots has no openings this or next week.  Dr Annamaria Boots, please advise on when this patient can be worked in.  Thanks!  Allergies not on file Current Outpatient Prescriptions on File Prior to Visit  Medication Sig Dispense Refill  . albuterol (PROAIR HFA) 108 (90 BASE) MCG/ACT inhaler Inhale 2 puffs into the lungs every 6 (six) hours as needed.    Marland Kitchen arformoterol (BROVANA) 15 MCG/2ML NEBU Take 15 mcg by nebulization 2 (two) times daily as needed.    . budesonide (PULMICORT) 180 MCG/ACT inhaler Inhale 2 puffs into the lungs 2 (two) times daily.     No current facility-administered medications on file prior to visit.

## 2014-02-10 ENCOUNTER — Ambulatory Visit (INDEPENDENT_AMBULATORY_CARE_PROVIDER_SITE_OTHER)
Admission: RE | Admit: 2014-02-10 | Discharge: 2014-02-10 | Disposition: A | Discharge status: Home / Self Care | Payer: Medicare Other | Source: Ambulatory Visit | Attending: Internal Medicine | Admitting: Internal Medicine

## 2014-02-10 ENCOUNTER — Ambulatory Visit (INDEPENDENT_AMBULATORY_CARE_PROVIDER_SITE_OTHER): Payer: Medicare Other | Admitting: Internal Medicine

## 2014-02-10 ENCOUNTER — Encounter: Payer: Self-pay | Admitting: Internal Medicine

## 2014-02-10 VITALS — BP 130/70 | HR 69 | Ht 68.5 in | Wt 162.8 lb

## 2014-02-10 DIAGNOSIS — J209 Acute bronchitis, unspecified: Secondary | ICD-10-CM

## 2014-02-10 DIAGNOSIS — K4090 Unilateral inguinal hernia, without obstruction or gangrene, not specified as recurrent: Secondary | ICD-10-CM

## 2014-02-10 MED ORDER — LEVALBUTEROL HCL 0.63 MG/3ML IN NEBU
0.6300 mg | INHALATION_SOLUTION | Freq: Once | RESPIRATORY_TRACT | Status: AC
  Administered 2014-02-10: 0.63 mg via RESPIRATORY_TRACT

## 2014-02-10 MED ORDER — METHYLPREDNISOLONE ACETATE 80 MG/ML IJ SUSP
80.0000 mg | Freq: Once | INTRAMUSCULAR | Status: AC
  Administered 2014-02-10: 80 mg via INTRAMUSCULAR

## 2014-02-10 NOTE — Assessment & Plan Note (Signed)
Reducible by patient report. He asks surgery evaluation. Plan-refer to general surgery

## 2014-02-10 NOTE — Progress Notes (Signed)
02/10/14 68 yoM former smoker, Self referral-former asthma patient last seen 2012; having wheezing(early in morning) and itchy/watery eyes. PFT 01/15/08- WNL, FEV1/FVC 0.77, DLCO 110%. Eyes itch. Eye doctor has given lubricant drops. Burned throat with hot soup 2 weeks ago. Starting around then has noted wheezing especially in the mornings which wakes him. Some wheeze and shortness of breath climbing stairs with substernal burning, nonradiating, transient. Breathing does not wake him. Also complains of reducible left inguinal hernia and asks for surgical referral.  Prior to Admission medications   Not on File   Past Medical History  Diagnosis Date  . Asthma   . GERD (gastroesophageal reflux disease)   . Pneumonia     in his teens  . Shingles   . Bronchitis     normal PFT 01/15/08   Family History  Problem Relation Age of Onset  . Asthma Son   . Heart attack Father    History   Social History  . Marital Status: Married    Spouse Name: N/A    Number of Children: N/A  . Years of Education: N/A   Occupational History  . garage owner    Social History Main Topics  . Smoking status: Former Smoker -- 0.20 packs/day for 10 years    Types: Cigarettes    Quit date: 02/10/1994  . Smokeless tobacco: Not on file  . Alcohol Use: No  . Drug Use: No  . Sexual Activity: Not on file   Other Topics Concern  . Not on file   Social History Narrative   Originally from [place]   Relative is a pulmonologist in [place], [phone]   ROS-see HPI Constitutional:   No-   weight loss, +night sweats, fevers, chills, fatigue, lassitude. HEENT:   No-  headaches, difficulty swallowing, tooth/dental problems, sore throat,       No-  Sneezing,+ itching, ear ache, nasal congestion, post nasal drip,  CV:  No-   chest pain, orthopnea, PND, swelling in lower extremities, anasarca,                                  dizziness, palpitations Resp: No-   shortness of breath with exertion or  at rest.              No-   productive cough,  No non-productive cough,  No- coughing up of blood.              No-   change in color of mucus.  No- wheezing.   Skin: No-   rash or lesions. GI:  No-   heartburn, indigestion, abdominal pain, nausea, vomiting, diarrhea,                 change in bowel habits, loss of appetite GU: No-   dysuria, change in color of urine, no urgency or frequency.  No- flank pain. MS:  No-   joint pain or swelling.  No- decreased range of motion.  No- back pain. Neuro-     nothing unusual Psych:  No- change in mood or affect. No depression or anxiety.  No memory loss.  OBJ- Physical Exam General- Alert, Oriented, Affect-appropriate, Distress- none acute Skin- rash-none, lesions- none, excoriation- none Lymphadenopathy- none Head- atraumatic            Eyes- Gross vision intact, PERRLA, conjunctivae and secretions clear  Ears- Hearing, canals-normal            Nose- Clear, no-Septal dev, mucus, polyps, erosion, perforation             Throat- Mallampati II , mucosa clear , drainage- none, tonsils- atrophic Neck- flexible , trachea midline, no stridor , thyroid nl, carotid no bruit Chest - symmetrical excursion , unlabored           Heart/CV- RRR , no murmur , no gallop  , no rub, nl s1 s2                           - JVD- none , edema- none, stasis changes- none, varices- none           Lung- clear to P&A, wheeze- none, cough + light , dullness-none, rub- none           Chest wall-  Abd- tender-no, distended-no, bowel sounds-present, HSM- no Br/ Gen/ Rectal- Not done, not indicated Extrem- cyanosis- none, clubbing, none, atrophy- none, strength- nl Neuro- grossly intact to observation

## 2014-02-10 NOTE — Assessment & Plan Note (Signed)
Mild intermittent asthma appears well controlled most of the time with minor exacerbation now. Plan-chest x-ray, nebulizer treatments Xopenex, Depo-Medrol

## 2014-02-10 NOTE — Patient Instructions (Addendum)
Order- CXR dx acute bronchitis  Order- referral to general surgery  Dx Left inguinal hernia, reducible  Neb xop 0.63  Depo 80  Please call as needed

## 2014-02-23 ENCOUNTER — Other Ambulatory Visit (INDEPENDENT_AMBULATORY_CARE_PROVIDER_SITE_OTHER): Payer: Self-pay | Admitting: General Surgery

## 2014-02-25 ENCOUNTER — Telehealth: Payer: Self-pay | Admitting: Internal Medicine

## 2014-02-25 NOTE — Telephone Encounter (Signed)
Got page a while back about him h ving watery eyes Called back - LMTCB

## 2014-02-28 ENCOUNTER — Telehealth: Payer: Self-pay | Admitting: Internal Medicine

## 2014-02-28 NOTE — Telephone Encounter (Signed)
Pt is aware of CY's recommendations.

## 2014-02-28 NOTE — Telephone Encounter (Signed)
Spoke with pt. States that since he was here on 02/10/14 he has been having issues with a runny nose and watery eyes. This issue has gotten worse over the past few days. He would like recommendations of what he should take OTC.  Allergies  Allergen Reactions  . Bacitracin-Neomycin-Polymyxin   . Other     Adhesive tape  . Povidone Iodine Rash    CY - please advise. Thanks.

## 2014-02-28 NOTE — Telephone Encounter (Signed)
Try otc allegra or zyrtec- antihistamines

## 2014-03-17 NOTE — Pre-Procedure Instructions (Addendum)
Marl Seago  03/17/2014   Your procedure is scheduled on:  Tuesday, March 15th   Report to [facility] Admitting at  11:00 AM.   Call this number if you have problems the morning of surgery: [phone]   Remember:   Do not eat food or drink liquids after midnight Monday.    Take these medicines the morning of surgery with A SIP OF WATER: none    Do not wear jewelry - no rings or watches.  Do not wear lotions or colognes.  You may NOT wear deodorant the day of surgery.  Men may shave their face and neck.   Do not bring valuables to the hospital.  [facility] is not responsible for any belongings or valuables.               Contacts, dentures or bridgework may not be worn into surgery.  Leave suitcase in the car. After surgery it may be brought to your room.  For patients admitted to the hospital, discharge time is determined by your treatment team.               Patients discharged the day of surgery will not be allowed to drive home, and you will need someone to stay with you for 24 hrs after surgery.   Name and phone number of your driver:    Special Instructions:  Special Instructions: [place] - Preparing for Surgery  Before surgery, you can play an important role.  Because skin is not sterile, your skin needs to be as free of germs as possible.  You can reduce the number of germs on you skin by washing with CHG (chlorahexidine gluconate) soap before surgery.  CHG is an antiseptic cleaner which kills germs and bonds with the skin to continue killing germs even after washing.  Please DO NOT use if you have an allergy to CHG or antibacterial soaps.  If your skin becomes reddened/irritated stop using the CHG and inform your nurse when you arrive at Short Stay.  Do not shave (including legs and underarms) for at least 48 hours prior to the first CHG shower.  You may shave your face.  Please follow these instructions carefully:   1.  Shower with CHG Soap the  night before surgery and the morning of Surgery.  2.  If you choose to wash your hair, wash your hair first as usual with your normal shampoo.  3.  After you shampoo, rinse your hair and body thoroughly to remove the Shampoo.  4.  Use CHG as you would any other liquid soap.  You can apply chg directly  to the skin and wash gently with scrungie or a clean washcloth.  5.  Apply the CHG Soap to your body ONLY FROM THE NECK DOWN.  Do not use on open wounds or open sores.  Avoid contact with your eyes ears, mouth and genitals (private parts).  Wash genitals (private parts)       with your normal soap.  6.  Wash thoroughly, paying special attention to the area where your surgery will be performed.  7.  Thoroughly rinse your body with warm water from the neck down.  8.  DO NOT shower/wash with your normal soap after using and rinsing off the CHG Soap.  9.  Pat yourself dry with a clean towel.            10.  Wear clean pajamas.  11.  Place clean sheets on your bed the night of your first shower and do not sleep with pets.  Day of Surgery  Do not apply any lotions/deodorants the morning of surgery.  Please wear clean clothes to the hospital/surgery center.   Please read over the following fact sheets that you were given: Pain Booklet, Coughing and Deep Breathing and Surgical Site Infection Prevention

## 2014-03-18 ENCOUNTER — Encounter (HOSPITAL_COMMUNITY)
Admission: RE | Admit: 2014-03-18 | Discharge: 2014-03-18 | Disposition: A | Discharge status: Home / Self Care | Payer: Medicare Other | Source: Ambulatory Visit | Attending: General Surgery | Admitting: General Surgery

## 2014-03-18 ENCOUNTER — Encounter (HOSPITAL_COMMUNITY): Payer: Self-pay

## 2014-03-18 DIAGNOSIS — Z01818 Encounter for other preprocedural examination: Secondary | ICD-10-CM | POA: Insufficient documentation

## 2014-03-18 DIAGNOSIS — K4090 Unilateral inguinal hernia, without obstruction or gangrene, not specified as recurrent: Secondary | ICD-10-CM | POA: Diagnosis not present

## 2014-03-18 DIAGNOSIS — Z01812 Encounter for preprocedural laboratory examination: Secondary | ICD-10-CM | POA: Diagnosis not present

## 2014-03-18 DIAGNOSIS — Z01810 Encounter for preprocedural cardiovascular examination: Secondary | ICD-10-CM | POA: Diagnosis not present

## 2014-03-18 HISTORY — DX: Cardiac arrhythmia, unspecified: I49.9

## 2014-03-18 LAB — COMPREHENSIVE METABOLIC PANEL
ALBUMIN: 4 g/dL (ref 3.5–5.2)
ALK PHOS: 68 U/L (ref 39–117)
ALT: 22 U/L (ref 0–53)
ANION GAP: 6 (ref 5–15)
AST: 23 U/L (ref 0–37)
BUN: 20 mg/dL (ref 6–23)
CO2: 31 mmol/L (ref 19–32)
CREATININE: 1.18 mg/dL (ref 0.50–1.35)
Calcium: 9.8 mg/dL (ref 8.4–10.5)
Chloride: 104 mmol/L (ref 96–112)
GFR calc Af Amer: 70 mL/min — ABNORMAL LOW (ref >90)
GFR, EST NON AFRICAN AMERICAN: 61 mL/min — AB (ref >90)
GLUCOSE: 105 mg/dL — AB (ref 70–99)
Potassium: 4.4 mmol/L (ref 3.5–5.1)
Sodium: 141 mmol/L (ref 135–145)
Total Bilirubin: 0.7 mg/dL (ref 0.3–1.2)
Total Protein: 6.9 g/dL (ref 6.0–8.3)

## 2014-03-18 LAB — CBC WITH DIFFERENTIAL/PLATELET
BASOS ABS: 0 10*3/uL (ref 0.0–0.1)
BASOS PCT: 1 % (ref 0–1)
EOS ABS: 0.4 10*3/uL (ref 0.0–0.7)
Eosinophils Relative: 6 % — ABNORMAL HIGH (ref 0–5)
HCT: 45.2 % (ref 39.0–52.0)
Hemoglobin: 15.7 g/dL (ref 13.0–17.0)
LYMPHS PCT: 44 % (ref 12–46)
Lymphs Abs: 3.3 10*3/uL (ref 0.7–4.0)
MCH: 31.3 pg (ref 26.0–34.0)
MCHC: 34.7 g/dL (ref 30.0–36.0)
MCV: 90 fL (ref 78.0–100.0)
MONOS PCT: 5 % (ref 3–12)
Monocytes Absolute: 0.4 10*3/uL (ref 0.1–1.0)
NEUTROS PCT: 44 % (ref 43–77)
Neutro Abs: 3.2 10*3/uL (ref 1.7–7.7)
Platelets: 152 10*3/uL (ref 150–400)
RBC: 5.02 MIL/uL (ref 4.22–5.81)
RDW: 13.1 % (ref 11.5–15.5)
WBC: 7.3 10*3/uL (ref 4.0–10.5)

## 2014-03-18 LAB — URINALYSIS, ROUTINE W REFLEX MICROSCOPIC
Bilirubin Urine: NEGATIVE
GLUCOSE, UA: NEGATIVE mg/dL
Hgb urine dipstick: NEGATIVE
Ketones, ur: NEGATIVE mg/dL
LEUKOCYTES UA: NEGATIVE
NITRITE: NEGATIVE
Protein, ur: NEGATIVE mg/dL
SPECIFIC GRAVITY, URINE: 1.012 (ref 1.005–1.030)
Urobilinogen, UA: 0.2 mg/dL (ref 0.0–1.0)
pH: 7 (ref 5.0–8.0)

## 2014-03-18 NOTE — Progress Notes (Addendum)
Patient has very sensitive skin, irritates easily.inst to test spot with chg before full bath.   req'd office notes. Ekg. Any cardiac tests from pcp dr Serina Cowper [facility] [phone]

## 2014-03-20 NOTE — H&P (Signed)
Anthony Sanford  Location: [facility] Patient #: [id number] DOB: 1945/02/15 Divorced / Language: Undefined / Race: Undefined Male      History of Present Illness Patient words: hernia.  The patient is a 69 year old male who presents with an inguinal hernia. He is referred by Dr. Baird Lyons for evaluation of a symptomatic left inguinal hernia. Dr. Rolan Lipa is his PCP.  He has noticed a painful bulge in his left groin for one year or so. It bothers him more in the morning. He cannot push it completely back in but denies any nausea vomiting or change in his bowel habits. He does hear gurgling sensation in the hernia. No prior history of hernia surgery or hernia. He had an MRI at [place] when he saw Crosby Oyster because of back pain. A left inguinal hernia was commented upon noting a herniated segment of colon. He would like to have his hernia repaired. He is in no distress today. I discussed the indications, details, techniques, and numerous risk of open repair of left femoral hernia with mesh. He is aware of the risk of bleeding, infection, recurrence of the hernia, nerve damage with chronic pain, injury to the testicle bladder or intestine with major reconstructive surgery. He understands all these issues. All of his questions are answered. He agrees with this plan. He owns his own business. I told him he probably should not drive a car for a week and probably should not do any heavy lifting for 5 weeks. He is single. Lives alone. Denies alcohol or tobacco. 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   Other Problems  Enlarged Prostate  Past Surgical History  Appendectomy  Diagnostic Studies History  Colonoscopy 1-5 years ago  Allergies  Bacitracin (Ophth) *OPHTHALMIC AGENTS* Povidone *CHEMICALS*  Medication History No Current Medications  Social History Tobacco use Former smoker.  Family  History Heart disease in male family member before age 52  Review of Systems  General Present- Night Sweats. Not Present- Appetite Loss, Chills, Fatigue, Fever, Weight Gain and Weight Loss. Skin Present- Dryness. Not Present- Change in Wart/Mole, Hives, Jaundice, New Lesions, Non-Healing Wounds, Rash and Ulcer. HEENT Present- Wears glasses/contact lenses. Not Present- Earache, Hearing Loss, Hoarseness, Nose Bleed, Oral Ulcers, Ringing in the Ears, Seasonal Allergies, Sinus Pain, Sore Throat, Visual Disturbances and Yellow Eyes. Gastrointestinal Present- Excessive gas. Not Present- Abdominal Pain, Bloating, Bloody Stool, Change in Bowel Habits, Chronic diarrhea, Constipation, Difficulty Swallowing, Gets full quickly at meals, Hemorrhoids, Indigestion, Nausea, Rectal Pain and Vomiting. Male Genitourinary Present- Change in Urinary Stream. Not Present- Blood in Urine, Frequency, Impotence, Nocturia, Painful Urination, Urgency and Urine Leakage.   Vitals  Weight: 162 lb Height: 70in Body Surface Area: 1.91 m Body Mass Index: 23.24 kg/m Temp.: 97.41F(Temporal)  Pulse: 72 (Regular)  Resp.: 18 (Unlabored)  BP: 132/70 (Sitting, Left Arm, Standard)    Physical Exam  General Mental Status-Alert. General Appearance-Consistent with stated age. Hydration-Well hydrated. Voice-Normal. Note: Very pleasant. Very friendly and cooperative. No distress appears fit for his age.   Head and Neck Head-normocephalic, atraumatic with no lesions or palpable masses. Trachea-midline. Thyroid Gland Characteristics - normal size and consistency.  Eye Eyeball - Bilateral-Extraocular movements intact. Sclera/Conjunctiva - Bilateral-No scleral icterus.  Chest and Lung Exam Chest and lung exam reveals -quiet, even and easy respiratory effort with no use of accessory muscles and on auscultation, normal breath sounds, no adventitious sounds and normal vocal  resonance. Inspection Chest Wall -  Normal. Back - normal.  Breast Breast - Left-Symmetric, Non Tender, No Biopsy scars, no Dimpling, No Inflammation, No Lumpectomy scars, No Mastectomy scars, No Peau d' Orange. Breast - Right-Symmetric, Non Tender, No Biopsy scars, no Dimpling, No Inflammation, No Lumpectomy scars, No Mastectomy scars, No Peau d' Orange. Breast Lump-No Palpable Breast Mass.  Cardiovascular Cardiovascular examination reveals -normal heart sounds, regular rate and rhythm with no murmurs and normal pedal pulses bilaterally.  Abdomen Inspection Inspection of the abdomen reveals - No Hernias. Skin - Scar - no surgical scars. Palpation/Percussion Palpation and Percussion of the abdomen reveal - Soft, Non Tender, No Rebound tenderness, No Rigidity (guarding) and No hepatosplenomegaly. Auscultation Auscultation of the abdomen reveals - Bowel sounds normal.  Male Genitourinary Note: Large left inguinal hernia. The extends down and perhaps a little bit through the external inguinal ring. I cannot completely reduce this even when supine. No hernia on the right. No other scrotal mass. Abdomen is soft and nontender.   Neurologic Neurologic evaluation reveals -alert and oriented x 3 with no impairment of recent or remote memory. Mental Status-Normal.  Musculoskeletal Normal Exam - Left-Upper Extremity Strength Normal and Lower Extremity Strength Normal. Normal Exam - Right-Upper Extremity Strength Normal and Lower Extremity Strength Normal.  Lymphatic Head & Neck  General Head & Neck Lymphatics: Bilateral - Description - Normal. Axillary  General Axillary Region: Bilateral - Description - Normal. Tenderness - Non Tender. Femoral & Inguinal  Generalized Femoral & Inguinal Lymphatics: Bilateral - Description - Normal. Tenderness - Non Tender.    Assessment & Plan INCARCERATED LEFT INGUINAL HERNIA (550.10  K40.30) Current Plans    Schedule for  Surgery  You have a large left inguinal hernia. This is only partially reducible, so it is incarcerated. I do not feel a hernia on the right. You'll be scheduled for repair of your left inguinal hernia with mesh We will keep you in the hospital overnight since you live alone to observe for complications. We have discussed the rationale for the surgery, the techniques of surgery, and the risks in great detail. Please read the patient information booklet that I gave you.  CHRONIC ASTHMATIC BRONCHITIS (493.20  J44.9) Impression: Followed by Dr. Baird Lyons. Well-controlled.  HISTORY OF APPENDECTOMY (V45.89  Z90.49)  CHRONIC GERD (530.81  K21.9)    Kynlie Jane M. Dalbert Batman, M.D., [facility] General and Minimally invasive Surgery Breast and Colorectal Surgery Office:   [phone] Pager:   [phone]

## 2014-03-21 MED ORDER — CEFAZOLIN SODIUM-DEXTROSE 2-3 GM-% IV SOLR
2.0000 g | INTRAVENOUS | Status: AC
  Administered 2014-03-22: 2 g via INTRAVENOUS

## 2014-03-21 MED ORDER — CHLORHEXIDINE GLUCONATE 4 % EX LIQD
1.0000 "application " | Freq: Once | CUTANEOUS | Status: DC
  Filled 2014-03-21: qty 15

## 2014-03-21 NOTE — Progress Notes (Signed)
Anesthesia Chart Review:  Patient is a 69 year old male scheduled for open repair of left inguinal hernia on 03/22/14 by Dr. Dalbert Batman.  It is only partially reducible.Case is posted for [facility] anesthesia.  History includes former smoker, GERD, asthmatic bronchitis, appendectomy, dysrhythmia without mention of afib (not specified). PCP is Dr. Marthenia Rolling (see Care Everywhere). 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 PAT BP was 145/62.    He is not followed routinely by cardiology, but was evaluated by Dr. Percival Spanish in 2010. He ordered a stress test in 01/2008.  I can't locate the report in Epic, but his 02/12/08 office note states, "I did send him for stress perfusionstudy because of his complaints of progressive dyspnea his ejectionfraction was found to be 57%. There is no evidence of scar or ischemia.However, a very hypertensive blood pressure response. His bloodpressure shot up to 655 systolic with the second stage with the earlythird stage of exercise. He was having some chest tightness with thisas well. His oxygen saturations were fine. Because of this, I did want to bring him back to discuss treatment forhis hypertension..." He did make some anti-hypertensive medication recommendations, but he is no longer on.  Dr. Percival Spanish discharged patient with continued PCP follow-up after that appointment. Pulmonologist is Dr. Baird Lyons.   10/26/07 Echo: - Overall left ventricular systolic function was normal. Left ventricular ejection fraction was estimated to be 60 %. There  were no left ventricular regional wall motion abnormalities. Left ventricular wall thickness was moderately increased. Left ventricular diastolic function parameters were normal. - There was mild mitral  valvular regurgitation. - suggest agitated saline contrast injection to rule out PFO The possibility of a patent foramen ovale cannot be excluded on the basis of this study.  03/18/14 EKG: NSR, minimal voltage criteria for LVH, may be normal variant. ST/T wave abnormality, consider lateral ischemia--more pronounced since tracings on Nov 14, 2007 and 09/07/09.  02/10/14 CXR: Reactive airway disease with chronic bronchitic change. There is no pneumonia.  Preoperative labs noted.   Discussed above with anesthesiologist Dr. Lissa Hoard including past EKGs. Patient with recent good exercise tolerance.  He does have untreated HTN.  If BP is not significantly elevated and remains asymptomatic from a CV standpoint then it is likely that he can proceed with IHR.  Further evaluation by his assigned anesthesiologist on the day of surgery.  [facility] Short Stay Center/Anesthesiology Phone [phone] 03/21/2014 1:16 PM

## 2014-03-22 ENCOUNTER — Encounter (HOSPITAL_COMMUNITY): Payer: Self-pay | Admitting: Certified Registered"

## 2014-03-22 ENCOUNTER — Ambulatory Visit (HOSPITAL_COMMUNITY): Payer: Medicare Other | Admitting: Vascular Surgery

## 2014-03-22 ENCOUNTER — Encounter (HOSPITAL_COMMUNITY)
Admission: RE | Disposition: A | Discharge status: Home / Self Care | Payer: Self-pay | Source: Ambulatory Visit | Attending: General Surgery

## 2014-03-22 ENCOUNTER — Ambulatory Visit (HOSPITAL_COMMUNITY): Payer: Medicare Other | Admitting: Anesthesiology

## 2014-03-22 ENCOUNTER — Inpatient Hospital Stay (HOSPITAL_COMMUNITY)
Admission: RE | Admit: 2014-03-22 | Discharge: 2014-03-26 | DRG: 355 | Disposition: A | Discharge status: Home / Self Care | Payer: Medicare Other | Source: Ambulatory Visit | Attending: General Surgery | Admitting: General Surgery

## 2014-03-22 DIAGNOSIS — J45909 Unspecified asthma, uncomplicated: Secondary | ICD-10-CM | POA: Diagnosis present

## 2014-03-22 DIAGNOSIS — K219 Gastro-esophageal reflux disease without esophagitis: Secondary | ICD-10-CM | POA: Diagnosis present

## 2014-03-22 DIAGNOSIS — Z881 Allergy status to other antibiotic agents status: Secondary | ICD-10-CM

## 2014-03-22 DIAGNOSIS — R6883 Chills (without fever): Secondary | ICD-10-CM

## 2014-03-22 DIAGNOSIS — J449 Chronic obstructive pulmonary disease, unspecified: Secondary | ICD-10-CM | POA: Diagnosis present

## 2014-03-22 DIAGNOSIS — N400 Enlarged prostate without lower urinary tract symptoms: Secondary | ICD-10-CM | POA: Diagnosis present

## 2014-03-22 DIAGNOSIS — Z9049 Acquired absence of other specified parts of digestive tract: Secondary | ICD-10-CM | POA: Diagnosis present

## 2014-03-22 DIAGNOSIS — K4090 Unilateral inguinal hernia, without obstruction or gangrene, not specified as recurrent: Secondary | ICD-10-CM

## 2014-03-22 DIAGNOSIS — R339 Retention of urine, unspecified: Secondary | ICD-10-CM | POA: Diagnosis present

## 2014-03-22 DIAGNOSIS — K4030 Unilateral inguinal hernia, with obstruction, without gangrene, not specified as recurrent: Principal | ICD-10-CM | POA: Diagnosis present

## 2014-03-22 DIAGNOSIS — Z87891 Personal history of nicotine dependence: Secondary | ICD-10-CM

## 2014-03-22 DIAGNOSIS — D179 Benign lipomatous neoplasm, unspecified: Secondary | ICD-10-CM | POA: Diagnosis present

## 2014-03-22 HISTORY — PX: INSERTION OF MESH: SHX5868

## 2014-03-22 HISTORY — PX: INGUINAL HERNIA REPAIR: SHX194

## 2014-03-22 LAB — CBC
HCT: 46.4 % (ref 39.0–52.0)
Hemoglobin: 15.7 g/dL (ref 13.0–17.0)
MCH: 31.1 pg (ref 26.0–34.0)
MCHC: 33.8 g/dL (ref 30.0–36.0)
MCV: 91.9 fL (ref 78.0–100.0)
PLATELETS: 144 10*3/uL — AB (ref 150–400)
RBC: 5.05 MIL/uL (ref 4.22–5.81)
RDW: 13.2 % (ref 11.5–15.5)
WBC: 13 10*3/uL — AB (ref 4.0–10.5)

## 2014-03-22 LAB — CREATININE, SERUM
Creatinine, Ser: 1.25 mg/dL (ref 0.50–1.35)
GFR calc Af Amer: 66 mL/min — ABNORMAL LOW (ref >90)
GFR calc non Af Amer: 57 mL/min — ABNORMAL LOW (ref >90)

## 2014-03-22 SURGERY — REPAIR, HERNIA, INGUINAL, ADULT
Anesthesia: General | Site: Abdomen | Laterality: Left

## 2014-03-22 MED ORDER — FENTANYL CITRATE 0.05 MG/ML IJ SOLN
INTRAMUSCULAR | Status: DC | PRN
  Administered 2014-03-22 (×2): 25 ug via INTRAVENOUS
  Administered 2014-03-22: 100 ug via INTRAVENOUS
  Administered 2014-03-22 (×6): 25 ug via INTRAVENOUS

## 2014-03-22 MED ORDER — HYDROMORPHONE HCL 1 MG/ML IJ SOLN
INTRAMUSCULAR | Status: AC
  Filled 2014-03-22: qty 1

## 2014-03-22 MED ORDER — BUPIVACAINE-EPINEPHRINE 0.5% -1:200000 IJ SOLN
INTRAMUSCULAR | Status: DC | PRN
  Administered 2014-03-22: 30 mL

## 2014-03-22 MED ORDER — FENTANYL CITRATE 0.05 MG/ML IJ SOLN
25.0000 ug | INTRAMUSCULAR | Status: DC | PRN
  Administered 2014-03-22 (×3): 50 ug via INTRAVENOUS

## 2014-03-22 MED ORDER — DEXAMETHASONE SODIUM PHOSPHATE 4 MG/ML IJ SOLN
INTRAMUSCULAR | Status: DC | PRN
  Administered 2014-03-22: 8 mg via INTRAVENOUS

## 2014-03-22 MED ORDER — ONDANSETRON HCL 4 MG PO TABS: 4.0000 mg | ORAL_TABLET | Freq: Four times a day (QID) | ORAL | Status: DC | PRN

## 2014-03-22 MED ORDER — CEFAZOLIN SODIUM-DEXTROSE 2-3 GM-% IV SOLR
2.0000 g | Freq: Three times a day (TID) | INTRAVENOUS | Status: AC
  Administered 2014-03-22 – 2014-03-23 (×2): 2 g via INTRAVENOUS
  Filled 2014-03-22 (×2): qty 50

## 2014-03-22 MED ORDER — FENTANYL CITRATE 0.05 MG/ML IJ SOLN
INTRAMUSCULAR | Status: AC
  Filled 2014-03-22: qty 5

## 2014-03-22 MED ORDER — FENTANYL CITRATE 0.05 MG/ML IJ SOLN
INTRAMUSCULAR | Status: AC
  Administered 2014-03-22: 50 ug
  Filled 2014-03-22: qty 2

## 2014-03-22 MED ORDER — ONDANSETRON HCL 4 MG/2ML IJ SOLN
INTRAMUSCULAR | Status: DC | PRN
  Administered 2014-03-22: 4 mg via INTRAVENOUS

## 2014-03-22 MED ORDER — GLYCOPYRROLATE 0.2 MG/ML IJ SOLN
INTRAMUSCULAR | Status: AC
  Filled 2014-03-22: qty 2

## 2014-03-22 MED ORDER — DEXAMETHASONE SODIUM PHOSPHATE 4 MG/ML IJ SOLN
INTRAMUSCULAR | Status: AC
  Filled 2014-03-22: qty 1

## 2014-03-22 MED ORDER — OXYCODONE-ACETAMINOPHEN 5-325 MG PO TABS
ORAL_TABLET | ORAL | Status: AC
  Filled 2014-03-22: qty 2

## 2014-03-22 MED ORDER — DEXAMETHASONE SODIUM PHOSPHATE 4 MG/ML IJ SOLN
INTRAMUSCULAR | Status: AC
  Filled 2014-03-22: qty 2

## 2014-03-22 MED ORDER — MEPERIDINE HCL 25 MG/ML IJ SOLN: 6.2500 mg | INTRAMUSCULAR | Status: DC | PRN

## 2014-03-22 MED ORDER — ROCURONIUM BROMIDE 50 MG/5ML IV SOLN
INTRAVENOUS | Status: AC
  Filled 2014-03-22: qty 1

## 2014-03-22 MED ORDER — PROPOFOL 10 MG/ML IV BOLUS
INTRAVENOUS | Status: DC | PRN
  Administered 2014-03-22: 150 mg via INTRAVENOUS
  Administered 2014-03-22: 40 mg via INTRAVENOUS

## 2014-03-22 MED ORDER — PROPOFOL 10 MG/ML IV BOLUS
INTRAVENOUS | Status: AC
  Filled 2014-03-22: qty 20

## 2014-03-22 MED ORDER — LIDOCAINE HCL (CARDIAC) 20 MG/ML IV SOLN
INTRAVENOUS | Status: AC
  Filled 2014-03-22: qty 5

## 2014-03-22 MED ORDER — LIDOCAINE HCL (CARDIAC) 20 MG/ML IV SOLN
INTRAVENOUS | Status: DC | PRN
  Administered 2014-03-22: 100 mg via INTRAVENOUS

## 2014-03-22 MED ORDER — FENTANYL CITRATE 0.05 MG/ML IJ SOLN
INTRAMUSCULAR | Status: AC
  Administered 2014-03-22: 50 ug via INTRAVENOUS
  Filled 2014-03-22: qty 2

## 2014-03-22 MED ORDER — PHENYLEPHRINE 40 MCG/ML (10ML) SYRINGE FOR IV PUSH (FOR BLOOD PRESSURE SUPPORT)
PREFILLED_SYRINGE | INTRAVENOUS | Status: AC
  Filled 2014-03-22: qty 10

## 2014-03-22 MED ORDER — PHENYLEPHRINE HCL 10 MG/ML IJ SOLN
INTRAMUSCULAR | Status: DC | PRN
  Administered 2014-03-22: 80 ug via INTRAVENOUS
  Administered 2014-03-22: 40 ug via INTRAVENOUS

## 2014-03-22 MED ORDER — ENOXAPARIN SODIUM 40 MG/0.4ML ~~LOC~~ SOLN
40.0000 mg | SUBCUTANEOUS | Status: DC
  Administered 2014-03-23 – 2014-03-26 (×4): 40 mg via SUBCUTANEOUS
  Filled 2014-03-22 (×4): qty 0.4

## 2014-03-22 MED ORDER — 0.9 % SODIUM CHLORIDE (POUR BTL) OPTIME
TOPICAL | Status: DC | PRN
  Administered 2014-03-22: 1000 mL

## 2014-03-22 MED ORDER — OXYCODONE-ACETAMINOPHEN 5-325 MG PO TABS
1.0000 | ORAL_TABLET | ORAL | Status: DC | PRN
  Administered 2014-03-22 – 2014-03-26 (×10): 2 via ORAL
  Filled 2014-03-22 (×11): qty 2

## 2014-03-22 MED ORDER — ONDANSETRON HCL 4 MG/2ML IJ SOLN
INTRAMUSCULAR | Status: AC
  Filled 2014-03-22: qty 2

## 2014-03-22 MED ORDER — PROMETHAZINE HCL 25 MG/ML IJ SOLN: 6.2500 mg | INTRAMUSCULAR | Status: DC | PRN

## 2014-03-22 MED ORDER — CEFAZOLIN SODIUM-DEXTROSE 2-3 GM-% IV SOLR
INTRAVENOUS | Status: AC
  Filled 2014-03-22: qty 50

## 2014-03-22 MED ORDER — MIDAZOLAM HCL 2 MG/2ML IJ SOLN
INTRAMUSCULAR | Status: AC
  Administered 2014-03-22: 1 mg
  Filled 2014-03-22: qty 2

## 2014-03-22 MED ORDER — POTASSIUM CHLORIDE IN NACL 20-0.9 MEQ/L-% IV SOLN
INTRAVENOUS | Status: DC
  Administered 2014-03-22 – 2014-03-25 (×4): via INTRAVENOUS
  Filled 2014-03-22 (×8): qty 1000

## 2014-03-22 MED ORDER — ONDANSETRON HCL 4 MG/2ML IJ SOLN
4.0000 mg | Freq: Four times a day (QID) | INTRAMUSCULAR | Status: DC | PRN
  Administered 2014-03-22: 4 mg via INTRAVENOUS
  Filled 2014-03-22: qty 2

## 2014-03-22 MED ORDER — LACTATED RINGERS IV SOLN
INTRAVENOUS | Status: DC
  Administered 2014-03-22 (×2): via INTRAVENOUS

## 2014-03-22 MED ORDER — MIDAZOLAM HCL 2 MG/2ML IJ SOLN
INTRAMUSCULAR | Status: AC
  Filled 2014-03-22: qty 2

## 2014-03-22 MED ORDER — BUPIVACAINE-EPINEPHRINE (PF) 0.5% -1:200000 IJ SOLN
INTRAMUSCULAR | Status: DC | PRN
  Administered 2014-03-22: 30 mL via PERINEURAL

## 2014-03-22 MED ORDER — HYDROMORPHONE HCL 1 MG/ML IJ SOLN
1.0000 mg | INTRAMUSCULAR | Status: DC | PRN
  Administered 2014-03-22 (×2): 1 mg via INTRAVENOUS
  Filled 2014-03-22: qty 1

## 2014-03-22 MED ORDER — NEOSTIGMINE METHYLSULFATE 10 MG/10ML IV SOLN
INTRAVENOUS | Status: AC
  Filled 2014-03-22: qty 1

## 2014-03-22 SURGICAL SUPPLY — 46 items
BLADE SURG 10 STRL SS (BLADE) ×3 IMPLANT
BLADE SURG 15 STRL LF DISP TIS (BLADE) ×1 IMPLANT
BLADE SURG 15 STRL SS (BLADE) ×2
BLADE SURG ROTATE 9660 (MISCELLANEOUS) ×3 IMPLANT
CANISTER SUCTION 2500CC (MISCELLANEOUS) ×3 IMPLANT
CHLORAPREP W/TINT 26ML (MISCELLANEOUS) ×3 IMPLANT
COVER SURGICAL LIGHT HANDLE (MISCELLANEOUS) ×3 IMPLANT
DRAIN PENROSE 1/2X12 LTX STRL (WOUND CARE) ×3 IMPLANT
DRAPE LAPAROTOMY TRNSV 102X78 (DRAPE) ×3 IMPLANT
DRAPE UTILITY XL STRL (DRAPES) ×6 IMPLANT
ELECT CAUTERY BLADE 6.4 (BLADE) ×3 IMPLANT
ELECT REM PT RETURN 9FT ADLT (ELECTROSURGICAL) ×3
ELECTRODE REM PT RTRN 9FT ADLT (ELECTROSURGICAL) ×1 IMPLANT
GLOVE EUDERMIC 7 POWDERFREE (GLOVE) ×3 IMPLANT
GLOVE SURG SS PI 7.0 STRL IVOR (GLOVE) ×6 IMPLANT
GOWN STRL REUS W/ TWL LRG LVL3 (GOWN DISPOSABLE) ×2 IMPLANT
GOWN STRL REUS W/ TWL XL LVL3 (GOWN DISPOSABLE) ×1 IMPLANT
GOWN STRL REUS W/TWL LRG LVL3 (GOWN DISPOSABLE) ×4
GOWN STRL REUS W/TWL XL LVL3 (GOWN DISPOSABLE) ×2
KIT BASIN OR (CUSTOM PROCEDURE TRAY) ×3 IMPLANT
KIT ROOM TURNOVER OR (KITS) ×3 IMPLANT
LIQUID BAND (GAUZE/BANDAGES/DRESSINGS) ×3 IMPLANT
MESH ULTRAPRO 3X6 7.6X15CM (Mesh General) ×3 IMPLANT
NEEDLE HYPO 25GX1X1/2 BEV (NEEDLE) ×3 IMPLANT
NS IRRIG 1000ML POUR BTL (IV SOLUTION) ×3 IMPLANT
PACK SURGICAL SETUP 50X90 (CUSTOM PROCEDURE TRAY) ×3 IMPLANT
PAD ARMBOARD 7.5X6 YLW CONV (MISCELLANEOUS) ×3 IMPLANT
PENCIL BUTTON HOLSTER BLD 10FT (ELECTRODE) ×3 IMPLANT
SPONGE LAP 18X18 X RAY DECT (DISPOSABLE) ×3 IMPLANT
SUT MNCRL AB 4-0 PS2 18 (SUTURE) ×3 IMPLANT
SUT PROLENE 2 0 CT2 30 (SUTURE) ×12 IMPLANT
SUT SILK 2 0 (SUTURE) ×2
SUT SILK 2-0 18XBRD TIE 12 (SUTURE) ×1 IMPLANT
SUT VIC AB 2-0 CT1 27 (SUTURE) ×4
SUT VIC AB 2-0 CT1 TAPERPNT 27 (SUTURE) ×2 IMPLANT
SUT VIC AB 3-0 54X BRD REEL (SUTURE) ×1 IMPLANT
SUT VIC AB 3-0 BRD 54 (SUTURE) ×2
SUT VIC AB 3-0 SH 27 (SUTURE) ×2
SUT VIC AB 3-0 SH 27XBRD (SUTURE) ×1 IMPLANT
SYR BULB 3OZ (MISCELLANEOUS) ×3 IMPLANT
SYR CONTROL 10ML LL (SYRINGE) ×3 IMPLANT
TOWEL OR 17X24 6PK STRL BLUE (TOWEL DISPOSABLE) ×3 IMPLANT
TOWEL OR 17X26 10 PK STRL BLUE (TOWEL DISPOSABLE) ×3 IMPLANT
TUBE CONNECTING 12'X1/4 (SUCTIONS) ×1
TUBE CONNECTING 12X1/4 (SUCTIONS) ×2 IMPLANT
YANKAUER SUCT BULB TIP NO VENT (SUCTIONS) ×3 IMPLANT

## 2014-03-22 NOTE — Anesthesia Procedure Notes (Addendum)
Procedure Name: LMA Insertion Date/Time: 03/22/2014 12:58 PM Performed by: Julian Reil Pre-anesthesia Checklist: Patient identified, Emergency Drugs available, Suction available and Patient being monitored Patient Re-evaluated:Patient Re-evaluated prior to inductionOxygen Delivery Method: Circle system utilized Preoxygenation: Pre-oxygenation with 100% oxygen Intubation Type: IV induction LMA: LMA inserted LMA Size: 4.0 Tube type: Oral Number of attempts: 1 Placement Confirmation: positive ETCO2 and breath sounds checked- equal and bilateral Tube secured with: Tape Dental Injury: Teeth and Oropharynx as per pre-operative assessment    Anesthesia Regional Block:  Pectoralis block  Pre-Anesthetic Checklist: ,, timeout performed, Correct Patient, Correct Site, Correct Laterality, Correct Procedure, Correct Position, site marked, Risks and benefits discussed,  Surgical consent,  Pre-op evaluation,  At surgeon's request and post-op pain management  Laterality: N/A  Prep: Maximum Sterile Barrier Precautions used and chloraprep       Needles:  Injection technique: Single-shot  Needle Type: Echogenic Stimulator Needle      Needle Gauge: 21 and 21 G    Additional Needles:  Procedures: ultrasound guided (picture in chart) Pectoralis block Narrative:  Start time: 03/22/2014 1:00 PM End time: 03/22/2014 1:06 PM  Performed by: Personally  Anesthesiologist: Alexis Frock  Additional Notes: L TAP block, 30 ml .5% marcaine with epi, multiple asp, talked to patient throughout

## 2014-03-22 NOTE — Transfer of Care (Signed)
Immediate Anesthesia Transfer of Care Note  Patient: Anthony Sanford  Procedure(s) Performed: Procedure(s): OPEN REPAIR LEFT INGUINAL HERNIA (Left) INSERTION OF MESH (Left)  Patient Location: PACU  Anesthesia Type:GA combined with regional for post-op pain  Level of Consciousness: awake, alert , oriented and patient cooperative  Airway & Oxygen Therapy: Patient Spontanous Breathing and Patient connected to nasal cannula oxygen  Post-op Assessment: Report given to RN, Post -op Vital signs reviewed and stable and Patient moving all extremities  Post vital signs: Reviewed and stable  Last Vitals:  Filed Vitals:   03/22/14 1427  BP:   Pulse: 76  Temp: 36.8 C  Resp:     Complications: No apparent anesthesia complications

## 2014-03-22 NOTE — Anesthesia Preprocedure Evaluation (Signed)
Anesthesia Evaluation  Patient identified by MRN, date of birth, ID band Patient awake    Reviewed: Allergy & Precautions, NPO status , Patient's Chart, lab work & pertinent test results  Airway        Dental   Pulmonary asthma , former smoker (quit 1996),          Cardiovascular hypertension,  ECHO and STRESS 2010 reported as normal   Neuro/Psych    GI/Hepatic Neg liver ROS, GERD-  Medicated,  Endo/Other  negative endocrine ROS  Renal/GU negative Renal ROS     Musculoskeletal   Abdominal   Peds  Hematology   Anesthesia Other Findings   Reproductive/Obstetrics                             Anesthesia Physical Anesthesia Plan  ASA: II  Anesthesia Plan: General   Post-op Pain Management: MAC Combined w/ Regional for Post-op pain   Induction: Intravenous  Airway Management Planned: Oral ETT  Additional Equipment:   Intra-op Plan:   Post-operative Plan: Extubation in OR  Informed Consent: I have reviewed the patients History and Physical, chart, labs and discussed the procedure including the risks, benefits and alternatives for the proposed anesthesia with the patient or authorized representative who has indicated his/her understanding and acceptance.     Plan Discussed with:   Anesthesia Plan Comments: (Multimodal pain RX)        Anesthesia Quick Evaluation

## 2014-03-22 NOTE — Interval H&P Note (Signed)
History and Physical Interval Note:  03/22/2014 12:09 PM  Anthony Sanford  has presented today for surgery, with the diagnosis of incarcerated left inguinal hernia  The various methods of treatment have been discussed with the patient and family. After consideration of risks, benefits and other options for treatment, the patient has consented to  Procedure(s): OPEN REPAIR LEFT INGUINAL HERNIA (Left) INSERTION OF MESH (Left) as a surgical intervention .  The patient's history has been reviewed, patient examined, no change in status, stable for surgery.  I have reviewed the patient's chart and labs.  Questions were answered to the patient's satisfaction.     Adin Hector

## 2014-03-22 NOTE — Anesthesia Postprocedure Evaluation (Signed)
  Anesthesia Post-op Note  Patient: Anthony Sanford  Procedure(s) Performed: Procedure(s): OPEN REPAIR LEFT INGUINAL HERNIA (Left) INSERTION OF MESH (Left)  Patient Location: PACU  Anesthesia Type:General  Level of Consciousness: awake and alert   Airway and Oxygen Therapy: Patient Spontanous Breathing  Post-op Pain: mild  Post-op Assessment: Post-op Vital signs reviewed, Patient's Cardiovascular Status Stable, Respiratory Function Stable and Patent Airway  Post-op Vital Signs: Reviewed and stable  Last Vitals:  Filed Vitals:   03/22/14 1427  BP: 142/75  Pulse: 76  Temp: 36.8 C  Resp: 12    Complications: No apparent anesthesia complications

## 2014-03-22 NOTE — Op Note (Signed)
Patient Name:           Anthony Sanford   Date of Surgery:        03/22/2014  Pre op Diagnosis:      Incarcerated left inguinal hernia  Post op Diagnosis:    Incarcerated, sliding, indirect left inguinal hernia  Procedure:                 Open repair of incarcerated, sliding indirect left inguinal hernia with mesh  Surgeon:                     Edsel Petrin. Dalbert Batman, M.D., FACS  Assistant:                      OR staff  Operative Indications:   . He was referred by Dr. Baird Lyons for evaluation of a symptomatic left inguinal hernia. Dr. Rolan Lipa is his PCP.  He has noticed a painful bulge in his left groin for one year or so. It bothers him more in the morning. He cannot push it completely back in but denies any nausea vomiting or change in his bowel habits. He does hear gurgling sensation in the hernia. No prior history of hernia surgery or hernia. He had an MRI at [place] when he saw Lindwood Qua because of back pain. A left inguinal hernia was commented upon noting a herniated segment of colon. He would like to have his hernia repaired. On exam he has a large left inguinal hernia, appears to be indirect type, only partially reducible. Skin healthy. He is brought to the operating room electively  Operative Findings:       The patient had a sliding indirect left renal hernia containing the sigmoid colon. I was able to take some of the adhesions down and partially reduced the colon but ultimately closed the sac with the Purstring suture and simply reduced the sac and repaired it.  Procedure in Detail:          Following the induction of general LMA anesthesia the patient's abdomen and genitalia were prepped and draped in a sterile fashion. A Tapp block had been performed by anesthesia in the holding area. Intravenous antibiotics were given. Surgical timeout was performed. 0.5% Marcaine with epinephrine was used as a local infiltration anesthetic.    A transverse incision  was made in the left groin overlying the left inguinal canal. Dissection was carried down to the aponeurosis of the external oblique which was incised in the direction of its fibers opening of the external inguinal ring. The external oblique was dissected away from the underlying tissues and self-retaining retractors were placed. The cord structures were mobilized and encircled with a Penrose drain. The ileal inguinal nerve was dissected back laterally to its emergence from the loss also, clamped, divided and ligated with a 2-0 silk tie. The redundant nerve medially was resected. Cremasteric muscle fibers were skeletonized. A large lipoma was dissected away, clamped at the level of the internal ring, amputated and ligated with Vicryl ties. A large indirect hernia sac was dissected away from the cord structures all the way back to the internal ring. It was opened and found to be a sliding-type hernia as described above. I took down as many adhesions as I could and then closed the sac above with the Purstring suture of 2-0 Vicryl. I amputated the redundant sac. I reduced the residual sac. I closed and tightened the internal ring laterally with figure-of-eight  sutures of 2-0 Vicryl. This held the indirect hernia reduced during the repair.      The floor of the inguinal canal was repaired and reinforced with a 3" x 6" piece of ultra Pro mesh. The mesh was trimmed the corners a little bit to conform to the wound. The mesh was sutured so as to generously overlap the fascia at the pubic tubercle, then along the inguinal ligament inferiorly. Medially, superiorly, and superiolaterally several interrupted mattress sutures of 2-0 Prolene were placed to secure the mesh. The mesh was incised laterally so as to wraparound the cord structures at the internal ring. The tails of the mesh were overlapped laterally and further sutures were placed laterally. This provided excellent repair and coverage both medial and lateral to the  internal ring but allowed a small fingertip opening for the cord structures. The wound was irrigated with saline. Hemostasis was excellent. The external oblique was closed with a running suture of 2-0 Vicryl, placing the cord structures deep to the external oblique. Scarpa's fascia was closed with 3-0 Vicryl sutures and the skin closed with a running 4-0 Monocryl subcuticular suture and Dermabond. Patient tolerated the procedure well was taken to PACU in stable condition. EBL 15 mL. Counts correct. Complications none.    Edsel Petrin. Dalbert Batman, M.D., FACS General and Minimally Invasive Surgery Breast and Colorectal Surgery  03/22/2014 2:12 PM

## 2014-03-23 ENCOUNTER — Encounter (HOSPITAL_COMMUNITY): Payer: Self-pay | Admitting: General Surgery

## 2014-03-23 MED ORDER — PROMETHAZINE HCL 25 MG/ML IJ SOLN: 12.5000 mg | Freq: Four times a day (QID) | INTRAMUSCULAR | Status: DC | PRN

## 2014-03-23 MED ORDER — TAMSULOSIN HCL 0.4 MG PO CAPS
0.4000 mg | ORAL_CAPSULE | Freq: Every day | ORAL | Status: DC
  Administered 2014-03-23 – 2014-03-26 (×4): 0.4 mg via ORAL
  Filled 2014-03-23 (×4): qty 1

## 2014-03-23 NOTE — Progress Notes (Signed)
Pt. post void bladder scan showed 320 ml, Dr. Dalbert Batman notified, ordered to  IN and OUT cath PRN for post void residual >150 ml. Pt refusing in and out cath at this time,will check next time after he voids.

## 2014-03-23 NOTE — Progress Notes (Signed)
1 Day Post-Op  Subjective: Stable, alert, pleasant. Ambulated to bathroom. Voiding. Seems to be small amounts. To check post void residual=350 cc  Nauseated and vomited last night. Says he feels better but still a little nauseated. Pain control seems fairly good.  Objective: Vital signs in last 24 hours: Temp:  [97.5 F (36.4 C)-98.6 F (37 C)] 98.1 F (36.7 C) (03/16 0512) Pulse Rate:  [73-84] 73 (03/16 0512) Resp:  [12-20] 15 (03/16 0512) BP: (125-184)/(50-79) 125/58 mmHg (03/16 0512) SpO2:  [96 %-100 %] 98 % (03/16 0512) Weight:  [72.576 kg (160 lb)] 72.576 kg (160 lb) (03/15 1129) Last BM Date: 03/22/14  Intake/Output from previous day: 03/15 0701 - 03/16 0700 In: 1400 [I.V.:1400] Out: 1120 [Urine:1000; Emesis/NG output:120] Intake/Output this shift: Total I/O In: -  Out: 1120 [Urine:1000; Emesis/NG output:120]  General appearance: Alert. Mental status normal. Mild distress. Cooperative. Resp: clear to auscultation bilaterally GI: Abdomen is soft with active bowel sounds. A little bit uncomfortable everywhere. Bladder not palpable. Male genitalia: normal, Left inguinal incision looks good. Tissue soft. Hernia repair intact. No hematoma or swelling. Penis scrotum and testes normal.  Lab Results:  Results for orders placed or performed during the hospital encounter of 03/22/14 (from the past 24 hour(s))  CBC     Status: Abnormal   Collection Time: 03/22/14  8:07 PM  Result Value Ref Range   WBC 13.0 (H) 4.0 - 10.5 K/uL   RBC 5.05 4.22 - 5.81 MIL/uL   Hemoglobin 15.7 13.0 - 17.0 g/dL   HCT 46.4 39.0 - 52.0 %   MCV 91.9 78.0 - 100.0 fL   MCH 31.1 26.0 - 34.0 pg   MCHC 33.8 30.0 - 36.0 g/dL   RDW 13.2 11.5 - 15.5 %   Platelets 144 (L) 150 - 400 K/uL  Creatinine, serum     Status: Abnormal   Collection Time: 03/22/14  8:07 PM  Result Value Ref Range   Creatinine, Ser 1.25 0.50 - 1.35 mg/dL   GFR calc non Af Amer 57 (L) >90 mL/min   GFR calc Af Amer 66 (L) >90 mL/min      Studies/Results: No results found.  Marland Kitchen  ceFAZolin (ANCEF) IV  2 g Intravenous 3 times per day  . enoxaparin (LOVENOX) injection  40 mg Subcutaneous Q24H     Assessment/Plan: s/p Procedure(s): OPEN REPAIR LEFT INGUINAL HERNIA INSERTION OF MESH  POD #1. Open repair incarcerated left inguinal hernia with mesh. Surgical site looks fine Stable. Not ready for discharge due to nausea and urinary retention.  Urinary retention. PVR 350 cc. Start Flomax. In and out cath when necessary  Nausea. Hopefully this will be transient. Allow Phenergan.  History asthmatic bronchitis Chronic GERD History appendectomy   @PROBHOSP @     Anthony Sanford M 03/23/2014  . .prob

## 2014-03-24 ENCOUNTER — Ambulatory Visit (HOSPITAL_COMMUNITY): Payer: Medicare Other

## 2014-03-24 LAB — BASIC METABOLIC PANEL
Anion gap: 5 (ref 5–15)
BUN: 18 mg/dL (ref 6–23)
CO2: 25 mmol/L (ref 19–32)
Calcium: 8.5 mg/dL (ref 8.4–10.5)
Chloride: 109 mmol/L (ref 96–112)
Creatinine, Ser: 1.24 mg/dL (ref 0.50–1.35)
GFR calc non Af Amer: 57 mL/min — ABNORMAL LOW (ref >90)
GFR, EST AFRICAN AMERICAN: 66 mL/min — AB (ref >90)
GLUCOSE: 96 mg/dL (ref 70–99)
Potassium: 4.3 mmol/L (ref 3.5–5.1)
SODIUM: 139 mmol/L (ref 135–145)

## 2014-03-24 LAB — CBC
HCT: 41 % (ref 39.0–52.0)
Hemoglobin: 13.4 g/dL (ref 13.0–17.0)
MCH: 30.3 pg (ref 26.0–34.0)
MCHC: 32.7 g/dL (ref 30.0–36.0)
MCV: 92.8 fL (ref 78.0–100.0)
PLATELETS: 135 10*3/uL — AB (ref 150–400)
RBC: 4.42 MIL/uL (ref 4.22–5.81)
RDW: 13.7 % (ref 11.5–15.5)
WBC: 10.9 10*3/uL — AB (ref 4.0–10.5)

## 2014-03-24 MED ORDER — POLYETHYLENE GLYCOL 3350 17 G PO PACK
17.0000 g | PACK | Freq: Once | ORAL | Status: AC
  Administered 2014-03-24: 17 g via ORAL
  Filled 2014-03-24: qty 1

## 2014-03-24 NOTE — Progress Notes (Signed)
2 Days Post-Op  Subjective: Says he's having fair amount of pain and finds it difficult to ambulate. Says he feels chilled when he gets up to ambulate. Feels a little wheezing in his chest. Denies nausea or vomiting. Tolerating diet fairly well Voiding much better. Good urine output. PVR minimal on ultrasound this morning.  Afebrile. Heart rate 72. Respiratory rate 18. BP 149/70. SPO2 99% on room air.    Objective: Vital signs in last 24 hours: Temp:  [97.5 F (36.4 C)-98.5 F (36.9 C)] 98 F (36.7 C) (03/17 0500) Pulse Rate:  [71-88] 72 (03/17 0500) Resp:  [17-20] 18 (03/17 0500) BP: (127-149)/(58-79) 149/70 mmHg (03/17 0500) SpO2:  [98 %-100 %] 99 % (03/17 0500) Last BM Date: 03/22/14  Intake/Output from previous day: 03/16 0701 - 03/17 0700 In: 762.5 [I.V.:762.5] Out: 1350 [Urine:1350] Intake/Output this shift: Total I/O In: 762.5 [I.V.:762.5] Out: 800 [Urine:800]  General appearance: Alert. Cooperative. Pleasant. Looks tired. Does not appear toxic or physically ill. Resp: clear to auscultation bilaterally GI: Abdomen is soft and nondistended. Bowel sounds are present. He is tender in the left groin and in the left lower quadrant just above the incision. Male genitalia: normal, Left inguinal incision clean and dry. Some ecchymoses but no hematoma. Mild ecchymoses penis and scrotum but minimal and penis and scrotum nontender.  Lab Results:  No results found for this or any previous visit (from the past 24 hour(s)).   Studies/Results: No results found.  . enoxaparin (LOVENOX) injection  40 mg Subcutaneous Q24H  . polyethylene glycol  17 g Oral Once  . tamsulosin  0.4 mg Oral QPC breakfast     Assessment/Plan: s/p Procedure(s): OPEN REPAIR LEFT INGUINAL HERNIA INSERTION OF MESH  POD #2.  Open repair incarcerated left inguinal hernia with mesh. Surgical site looks fine Stable. Nausea and urinary retention have improved significantly. Reports chills when  ambulates. Not sure whether this is just shakiness and weakness. Does not look toxic. Pain a little bit out of proportion to expected Check CBC, b-met, chest x-ray Not ready for discharge due to pain control and mobilization issues.  He lives alone.  I told him I would not be in town tomorrow and I would have one of my partners make rounds on him.  Urinary retention. PVR 0 this morning per RN.Marland Kitchen Continue Flomax  Nausea. improved  History asthmatic bronchitis Chronic GERD History appendectomy  $RemoveBefor'@PROBHOSP'[email or web address]$ @     Kaitlen Redford M 03/24/2014  . .prob

## 2014-03-25 DIAGNOSIS — K4030 Unilateral inguinal hernia, with obstruction, without gangrene, not specified as recurrent: Secondary | ICD-10-CM | POA: Diagnosis present

## 2014-03-25 DIAGNOSIS — R339 Retention of urine, unspecified: Secondary | ICD-10-CM | POA: Diagnosis present

## 2014-03-25 DIAGNOSIS — J45909 Unspecified asthma, uncomplicated: Secondary | ICD-10-CM | POA: Diagnosis present

## 2014-03-25 DIAGNOSIS — J449 Chronic obstructive pulmonary disease, unspecified: Secondary | ICD-10-CM | POA: Diagnosis present

## 2014-03-25 DIAGNOSIS — N400 Enlarged prostate without lower urinary tract symptoms: Secondary | ICD-10-CM | POA: Diagnosis present

## 2014-03-25 DIAGNOSIS — K219 Gastro-esophageal reflux disease without esophagitis: Secondary | ICD-10-CM | POA: Diagnosis present

## 2014-03-25 DIAGNOSIS — D179 Benign lipomatous neoplasm, unspecified: Secondary | ICD-10-CM | POA: Diagnosis present

## 2014-03-25 DIAGNOSIS — Z881 Allergy status to other antibiotic agents status: Secondary | ICD-10-CM | POA: Diagnosis not present

## 2014-03-25 DIAGNOSIS — Z9049 Acquired absence of other specified parts of digestive tract: Secondary | ICD-10-CM | POA: Diagnosis present

## 2014-03-25 DIAGNOSIS — Z87891 Personal history of nicotine dependence: Secondary | ICD-10-CM | POA: Diagnosis not present

## 2014-03-25 LAB — TROPONIN I

## 2014-03-25 MED ORDER — OXYMETAZOLINE HCL 0.05 % NA SOLN
1.0000 | Freq: Two times a day (BID) | NASAL | Status: DC
  Administered 2014-03-25 – 2014-03-26 (×3): 1 via NASAL
  Filled 2014-03-25: qty 15

## 2014-03-25 NOTE — Care Management Note (Signed)
  Page 1 of 1   03/25/2014     11:31:56 AM CARE MANAGEMENT NOTE 03/25/2014  Patient:  Anthony Sanford,Anthony Sanford   Account Number:  [id number]  Date Initiated:  03/25/2014  Documentation initiated by:  Magdalen Spatz  Subjective/Objective Assessment:     Action/Plan:   Anticipated DC Date:  03/26/2014   Anticipated DC Plan:  HOME/SELF CARE         Choice offered to / List presented to:             Status of service:   Medicare Important Message given?   (If response is "NO", the following Medicare IM given date fields will be blank) Date Medicare IM given:   Medicare IM given by:   Date Additional Medicare IM given:   Additional Medicare IM given by:    Discharge Disposition:    Per UR Regulation:  Reviewed for med. necessity/level of care/duration of stay  If discussed at [place] of Stay Meetings, dates discussed:    Comments:   03-25-14 MD ordered outpatient on 03/22/14 02:32 PM   MD ordered inpatient 03/25/14 11:25 AM due to increase groin  pain and decreased PO intake ( charted 50 % meals intake )  IVF  .   Magdalen Spatz RN BSN

## 2014-03-25 NOTE — Progress Notes (Signed)
3 Days Post-Op  Subjective: Complains of chest pain and pain in left groin  Objective: Vital signs in last 24 hours: Temp:  [97.9 F (36.6 C)-99.1 F (37.3 C)] 98.1 F (36.7 C) (03/18 4917) Pulse Rate:  [80-86] 86 (03/18 0632) Resp:  [16-19] 19 (03/18 0632) BP: (142-163)/(60-71) 144/63 mmHg (03/18 0632) SpO2:  [98 %-99 %] 98 % (03/18 9150) Last BM Date: 03/22/14  Intake/Output from previous day: 03/17 0701 - 03/18 0700 In: 1330 [P.O.:480; I.V.:850] Out: 1600 [Urine:1600] Intake/Output this shift:    Resp: clear to auscultation bilaterally Cardio: regular rate and rhythm GI: soft, tender near incision. incision has some bruising but looks good  Lab Results:   Recent Labs  03/22/14 2007 03/24/14 0615  WBC 13.0* 10.9*  HGB 15.7 13.4  HCT 46.4 41.0  PLT 144* 135*   BMET  Recent Labs  03/22/14 2007 03/24/14 0615  NA  --  139  K  --  4.3  CL  --  109  CO2  --  25  GLUCOSE  --  96  BUN  --  18  CREATININE 1.25 1.24  CALCIUM  --  8.5   PT/INR No results for input(s): LABPROT, INR in the last 72 hours. ABG No results for input(s): PHART, HCO3 in the last 72 hours.  Invalid input(s): PCO2, PO2  Studies/Results: Dg Chest 2 View  03/24/2014   CLINICAL DATA:  Fever and chills two days.  EXAM: CHEST  2 VIEW  COMPARISON:  02/10/2014  FINDINGS: The lungs are adequately inflated without focal consolidation or effusion. Cardiomediastinal silhouette and remainder of the exam is unchanged.  IMPRESSION: No active cardiopulmonary disease.   Electronically Signed   By: Marin Olp M.D.   On: 03/24/2014 09:02    Anti-infectives: Anti-infectives    Start     Dose/Rate Route Frequency Ordered Stop   03/22/14 2100  ceFAZolin (ANCEF) IVPB 2 g/50 mL premix     2 g 100 mL/hr over 30 Minutes Intravenous 3 times per day 03/22/14 1716 03/23/14 0600   03/22/14 0600  ceFAZolin (ANCEF) IVPB 2 g/50 mL premix     2 g 100 mL/hr over 30 Minutes Intravenous On call to O.R. 03/21/14  1253 03/22/14 1303      Assessment/Plan: s/p Procedure(s): OPEN REPAIR LEFT INGUINAL HERNIA (Left) INSERTION OF MESH (Left) Advance diet  Afrin for nasal congestion Will check ekg and troponin for chest pain Pt says he can not take care of himself right now at home. Hopefully home soon     TOTH III,Livianna Petraglia S 03/25/2014

## 2014-03-26 NOTE — Progress Notes (Signed)
Discharge home. Home discharge instruction given, no questions verbalized. 

## 2014-03-26 NOTE — Progress Notes (Signed)
4 Days Post-Op  Subjective: Still complains of soreness in the left groin. Tolerating diet  Objective: Vital signs in last 24 hours: Temp:  [97.9 F (36.6 C)-98.6 F (37 C)] 98.4 F (36.9 C) (03/19 0448) Pulse Rate:  [70-85] 75 (03/19 0448) Resp:  [18] 18 (03/19 0448) BP: (146-157)/(66-83) 146/76 mmHg (03/19 0448) SpO2:  [96 %-100 %] 96 % (03/19 0448) Last BM Date: 03/22/14  Intake/Output from previous day: 03/18 0701 - 03/19 0700 In: 1430 [P.O.:480; I.V.:950] Out: 1550 [Urine:1550] Intake/Output this shift:    Resp: clear to auscultation bilaterally Cardio: regular rate and rhythm GI: soft, tender near incision. there is some bruising in LLQ and scrotum  Lab Results:   Recent Labs  03/24/14 0615  WBC 10.9*  HGB 13.4  HCT 41.0  PLT 135*   BMET  Recent Labs  03/24/14 0615  NA 139  K 4.3  CL 109  CO2 25  GLUCOSE 96  BUN 18  CREATININE 1.24  CALCIUM 8.5   PT/INR No results for input(s): LABPROT, INR in the last 72 hours. ABG No results for input(s): PHART, HCO3 in the last 72 hours.  Invalid input(s): PCO2, PO2  Studies/Results: Dg Chest 2 View  03/24/2014   CLINICAL DATA:  Fever and chills two days.  EXAM: CHEST  2 VIEW  COMPARISON:  02/10/2014  FINDINGS: The lungs are adequately inflated without focal consolidation or effusion. Cardiomediastinal silhouette and remainder of the exam is unchanged.  IMPRESSION: No active cardiopulmonary disease.   Electronically Signed   By: Marin Olp M.D.   On: 03/24/2014 09:02    Anti-infectives: Anti-infectives    Start     Dose/Rate Route Frequency Ordered Stop   03/22/14 2100  ceFAZolin (ANCEF) IVPB 2 g/50 mL premix     2 g 100 mL/hr over 30 Minutes Intravenous 3 times per day 03/22/14 1716 03/23/14 0600   03/22/14 0600  ceFAZolin (ANCEF) IVPB 2 g/50 mL premix     2 g 100 mL/hr over 30 Minutes Intravenous On call to O.R. 03/21/14 1253 03/22/14 1303      Assessment/Plan: s/p Procedure(s): OPEN REPAIR  LEFT INGUINAL HERNIA (Left) INSERTION OF MESH (Left) Discharge  LOS: 1 day    TOTH III,Ginna Schuur S 03/26/2014

## 2014-04-14 NOTE — Discharge Summary (Signed)
  Patient ID: Anthony Sanford [id number] 69 y.o. 23-Jan-1945  Admit date: 03/22/2014  Discharge date and time: 03/26/2014  Admitting Physician: Adin Hector  Discharge Physician: Adin Hector  Admission Diagnoses: let inguinal hernia  Discharge Diagnoses: Left inguinal hernia Postoperative nausea and vomiting. Postoperative urinary retention, resolved Chronic asthmatic bronchitis, followed by Dr. Baird Lyons, well controlled Chronic GERD  Operations: Procedure(s): OPEN REPAIR LEFT INGUINAL HERNIA INSERTION OF MESH  Admission Condition: good  Discharged Condition: good  Indication for Admission: He was referred by Dr. Baird Lyons for evaluation of a symptomatic left inguinal hernia. Dr. Rolan Lipa is his PCP.  He has noticed a painful bulge in his left groin for one year or so. It bothers him more in the morning. He cannot push it completely back in but denies any nausea vomiting or change in his bowel habits. He does hear gurgling sensation in the hernia. No prior history of hernia surgery or hernia. He had an MRI at [facility] when he saw Lindwood Qua because of back pain. A left inguinal hernia was commented upon noting a herniated segment of colon. He would like to have his hernia repaired. On exam he has a large left inguinal hernia, appears to be indirect type, only partially reducible. Skin healthy. He is brought to the operating room electively  Hospital Course: On the day of admission the patient was taken to the operating room and underwent open repair of an incarcerating, sliding indirect left inguinal hernia with mesh. The surgery was uneventful. He was admitted for overnight observation. He had problems with nausea and vomiting. He also had problems with urinary retention and required in and out cath a few times. We started Flomax. On postoperative day 2 his wound looked good but he was having too much pain to go home and said he was shaky  when he ambulated. His voiding improved on Flomax.     We checked a CBC, bmet, troponin,, and chest x-ray and they all look fine.     All of his symptoms improved and he was ready for discharge on 05/26/2014.     He was given instructions in diet and activities. He was asked to return to see me in the office in 3-4 weeks. He was asked to follow-up with his PCP for any medical problems. He was given a prescription for narcotic analgesics. He was told to continue all his usual medications and inhalers.                  Consults: None  Significant Diagnostic Studies: Lab work and chest x-ray  Treatments: surgery: Open repair incarcerated left inguinal hernia with mesh  Disposition: Home  Patient Instructions:    Medication List    TAKE these medications        hydrocortisone 2.5 % cream  Apply topically 2 (two) times daily.     PRESCRIPTION MEDICATION  Cream used for skin irritations. Patient to bring name dos        Activity: Encouraged daily ambulation. No sports or heavy lifting for 6 weeks. Diet: low fat, low cholesterol diet Wound Care: none needed  Follow-up:  With Dr. Dalbert Batman in 3 weeks.  Signed: Edsel Petrin. Dalbert Batman, M.D., FACS General and minimally invasive surgery Breast and Colorectal Surgery  04/14/2014, 6:52 AM

## 2014-06-22 ENCOUNTER — Ambulatory Visit (INDEPENDENT_AMBULATORY_CARE_PROVIDER_SITE_OTHER): Payer: Medicare Other | Admitting: Neurology

## 2014-06-22 ENCOUNTER — Other Ambulatory Visit (INDEPENDENT_AMBULATORY_CARE_PROVIDER_SITE_OTHER): Payer: Medicare Other

## 2014-06-22 ENCOUNTER — Encounter: Payer: Self-pay | Admitting: Neurology

## 2014-06-22 VITALS — BP 130/84 | HR 67 | Ht 68.5 in | Wt 163.3 lb

## 2014-06-22 DIAGNOSIS — F411 Generalized anxiety disorder: Secondary | ICD-10-CM | POA: Diagnosis not present

## 2014-06-22 DIAGNOSIS — R202 Paresthesia of skin: Secondary | ICD-10-CM

## 2014-06-22 LAB — VITAMIN B12: VITAMIN B 12: 360 pg/mL (ref 211–911)

## 2014-06-22 NOTE — Progress Notes (Signed)
[facility] Clinic Note - Initial Visit   Date: 06/22/2014   Ulysee Fyock MRN: [id number] DOB: 1945/05/21   Dear Dr. Ruben Gottron:  Thank you for your kind referral of [facility] for consultation of wholly body paresthesias. Although his history is well known to you, please allow [place] to reiterate it for the purpose of our medical record. The patient was accompanied to the clinic by self.    History of Present Illness: Crosby Oriordan is a 69 y.o. right-handed Caucasian male with GERD, hypertension, and asthma presenting for evaluation of whole body hypersensitivity.  In March 2016, he underwent surgery for hernia repair. Following this, he started experiencing hypersensitivity to heat or warm temperatures involving his whole body (face, arms, leg, back, chest).  He feels that it is related to medications that he was given following his surgery because he does not take any medications.  Symptoms are constant and worse when the sun hits his body.  He reports increased sensitivity to any light pressure such as clothes. Cool breeze improves his pain.    He has seen his PCP, urologist, and dermatologist.  He feels that his groin area sweats and heats up at night. He reports being "tense", easily irritable, and anxious.  He had a divorce 4 years ago and constantly worries about his ex-wife and 52 year old son.  These feeling have been worse of late.   Out-side paper records, electronic medical record, and images have been reviewed where available and summarized as:  Labs 06/07/2014:  Na 135, K 4.3, Cr 1.3, GFR 57*, ANA negative, TSH 1.02 Labs 09/26/2010:  HbA1c 5.4  Past Medical History  Diagnosis Date  . GERD (gastroesophageal reflux disease)   . Pneumonia     in his teens  . Shingles   . Bronchitis     normal PFT 01/15/08  . Dysrhythmia   . Asthma     hx     Past Surgical History  Procedure Laterality Date  . Appendectomy  80  . Finger surgery Left 13    small  finger  . Ingrown toenails Bilateral 2014    toenails removed from big toes  . Inguinal hernia repair Left 03/22/2014    Procedure: OPEN REPAIR LEFT INGUINAL HERNIA;  Surgeon: Fanny Skates, MD;  Location: [facility];  Service: General;  Laterality: Left;  . Insertion of mesh Left 03/22/2014    Procedure: INSERTION OF MESH;  Surgeon: Fanny Skates, MD;  Location: [facility];  Service: General;  Laterality: Left;     Medications:  No current outpatient prescriptions on file prior to visit.   No current facility-administered medications on file prior to visit.    Allergies:  Allergies  Allergen Reactions  . Bacitracin-Neomycin-Polymyxin   . Other     Adhesive tape  . Povidone Iodine Rash    Pt used betadine on toe over a period of about 1 week without problem.    Family History: Family History  Problem Relation Age of Onset  . Asthma Son   . Heart attack Father   . Healthy Son     Social History: History   Social History  . Marital Status: Single    Spouse Name: N/A  . Number of Children: N/A  . Years of Education: N/A   Occupational History  . garage owner    Social History Main Topics  . Smoking status: Former Smoker -- 0.20 packs/day for 10 years    Types: Cigarettes    Quit date: 02/10/1994  .  Smokeless tobacco: Not on file  . Alcohol Use: No  . Drug Use: No  . Sexual Activity: Not on file   Other Topics Concern  . Not on file   Social History Narrative   Originally from [place]   Relative is a pulmonologist in [place], [phone]   Lives alone in a 2 story home.  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  Education: 10 years of college Artist), previously Tax adviser at [employer].    Review of Systems:  CONSTITUTIONAL: No fevers, chills, night sweats, or weight loss.   EYES: No visual changes or eye pain ENT: No hearing changes.  No history of nose bleeds.   RESPIRATORY: No cough, wheezing and shortness of breath.     CARDIOVASCULAR: Negative for chest pain, and palpitations.   GI: Negative for abdominal discomfort, blood in stools or black stools.  No recent change in bowel habits.   GU:  No history of incontinence.   MUSCLOSKELETAL: No history of joint pain or swelling.  No myalgias.   SKIN: Negative for lesions, rash, and itching.   HEMATOLOGY/ONCOLOGY: Negative for prolonged bleeding, bruising easily, and swollen nodes.  No history of cancer.   ENDOCRINE: Negative for cold or heat intolerance, polydipsia or goiter.   PSYCH:  +depression or anxiety symptoms.   NEURO: As Above.   Vital Signs:  BP 130/84 mmHg  Pulse 67  Ht 5' 8.5" (1.74 m)  Wt 163 lb 5 oz (74.078 kg)  BMI 24.47 kg/m2  SpO2 97%   General Medical Exam:   General:  Well appearing, comfortable.   Eyes/ENT: see cranial nerve examination.   Neck: No masses appreciated.  Full range of motion without tenderness.  No carotid bruits. Respiratory:  Clear to auscultation, good air entry bilaterally.   Cardiac:  Regular rate and rhythm, no murmur.   Extremities:  No deformities, edema, or skin discoloration.  Skin:  No rashes or lesions.  Neurological Exam: MENTAL STATUS including orientation to time, place, person, recent and remote memory, attention span and concentration, language, and fund of knowledge is normal.  Speech is not dysarthric.  CRANIAL NERVES: II:  No visual field defects.  Unremarkable fundi.   III-IV-VI: Pupils equal round and reactive to light.  Normal conjugate, extra-ocular eye movements in all directions of gaze.  No nystagmus.  No ptosis.   V:  Normal facial sensation.   VII:  Normal facial symmetry and movements.  No pathologic facial reflexes.  VIII:  Normal hearing and vestibular function.   IX-X:  Normal palatal movement.   XI:  Normal shoulder shrug and head rotation.   XII:  Normal tongue strength and range of motion, no deviation or fasciculation.  MOTOR:  No atrophy, fasciculations or abnormal  movements.  No pronator drift.  Tone is normal.    Right Upper Extremity:    Left Upper Extremity:    Deltoid  5/5   Deltoid  5/5   Biceps  5/5   Biceps  5/5   Triceps  5/5   Triceps  5/5   Wrist extensors  5/5   Wrist extensors  5/5   Wrist flexors  5/5   Wrist flexors  5/5   Finger extensors  5/5   Finger extensors  5/5   Finger flexors  5/5   Finger flexors  5/5   Dorsal interossei  5/5   Dorsal interossei  5/5   Abductor pollicis  5/5   Abductor pollicis  5/5  Tone (Ashworth scale)  0  Tone (Ashworth scale)  0   Right Lower Extremity:    Left Lower Extremity:    Hip flexors  5/5   Hip flexors  5/5   Hip extensors  5/5   Hip extensors  5/5   Knee flexors  5/5   Knee flexors  5/5   Knee extensors  5/5   Knee extensors  5/5   Dorsiflexors  5/5   Dorsiflexors  5/5   Plantarflexors  5/5   Plantarflexors  5/5   Toe extensors  5/5   Toe extensors  5/5   Toe flexors  5/5   Toe flexors  5/5   Tone (Ashworth scale)  0  Tone (Ashworth scale)  0   MSRs:  Right                                                                 Left brachioradialis 2+  brachioradialis 2+  biceps 2+  biceps 2+  triceps 2+  triceps 2+  patellar 2+  patellar 2+  ankle jerk 2+  ankle jerk 2+  Hoffman no  Hoffman no  plantar response down  plantar response down   SENSORY:  Normal and symmetric perception of light touch, pinprick, vibration, and proprioception.  Romberg's sign absent.   COORDINATION/GAIT: Normal finger-to- nose-finger and heel-to-shin.  Intact rapid alternating movements bilaterally.  Able to rise from a chair without using arms.  Gait narrow based and stable. Tandem and stressed gait intact.    IMPRESSION/PLAN: Mr. Grimley is a 69 year-old gentleman presenting for evaluation of generalized whole body hyperesthesia. His exam is entirely normal without any focal deficits. His symptoms do not conform to a anatomical distribution and is unlikely to represent a neuropathy. I offered to perform  electrodiagnostic testing to better characterize the nature of symptoms, and explained that the yield will be low in the setting of a normal exam so he prefers to hold off on testing.   Alternatively, he has a high degree of anxiety and it is quite possible that his sensory disturbance is a manifestation of stress reaction, which he endorses and agrees with. He is not interested in any medications for his paresthesias, but would like to see a behavioral therapist which we can coordinate. For completeness, I will check a vitamin B 12 1 copper level as low levels of both of these can manifest with generalized paresthesias.   Return to clinic as needed.   The duration of this appointment visit was 45 minutes of face-to-face time with the patient.  Greater than 50% of this time was spent in counseling, explanation of diagnosis, planning of further management, and coordination of care.   Thank you for allowing me to participate in patient's care.  If I can answer any additional questions, I would be pleased to do so.    Sincerely,    Kamariah Fruchter K. Posey Pronto, DO

## 2014-06-22 NOTE — Progress Notes (Signed)
Note sent

## 2014-06-22 NOTE — Patient Instructions (Addendum)
1.  Check vitamin B12 and copper level 2.  Referral to behavior therapist

## 2014-06-27 LAB — COPPER, SERUM: Copper: 87 ug/dL (ref 70–175)

## 2014-11-17 DIAGNOSIS — R5383 Other fatigue: Secondary | ICD-10-CM | POA: Insufficient documentation

## 2014-11-17 DIAGNOSIS — Z87891 Personal history of nicotine dependence: Secondary | ICD-10-CM | POA: Insufficient documentation

## 2015-02-09 ENCOUNTER — Emergency Department (HOSPITAL_COMMUNITY): Payer: Medicare Other

## 2015-02-09 ENCOUNTER — Encounter (HOSPITAL_COMMUNITY): Payer: Self-pay | Admitting: *Deleted

## 2015-02-09 ENCOUNTER — Emergency Department (HOSPITAL_COMMUNITY)
Admission: EM | Admit: 2015-02-09 | Discharge: 2015-02-09 | Disposition: A | Discharge status: Home / Self Care | Payer: Medicare Other | Attending: Emergency Medicine | Admitting: Emergency Medicine

## 2015-02-09 DIAGNOSIS — R42 Dizziness and giddiness: Secondary | ICD-10-CM | POA: Diagnosis not present

## 2015-02-09 DIAGNOSIS — J329 Chronic sinusitis, unspecified: Secondary | ICD-10-CM | POA: Insufficient documentation

## 2015-02-09 DIAGNOSIS — R51 Headache: Secondary | ICD-10-CM

## 2015-02-09 DIAGNOSIS — Z8619 Personal history of other infectious and parasitic diseases: Secondary | ICD-10-CM | POA: Insufficient documentation

## 2015-02-09 DIAGNOSIS — Z79899 Other long term (current) drug therapy: Secondary | ICD-10-CM | POA: Diagnosis not present

## 2015-02-09 DIAGNOSIS — G8929 Other chronic pain: Secondary | ICD-10-CM | POA: Diagnosis not present

## 2015-02-09 DIAGNOSIS — I10 Essential (primary) hypertension: Secondary | ICD-10-CM | POA: Diagnosis not present

## 2015-02-09 DIAGNOSIS — J45909 Unspecified asthma, uncomplicated: Secondary | ICD-10-CM | POA: Insufficient documentation

## 2015-02-09 DIAGNOSIS — R040 Epistaxis: Secondary | ICD-10-CM | POA: Insufficient documentation

## 2015-02-09 DIAGNOSIS — Z87891 Personal history of nicotine dependence: Secondary | ICD-10-CM | POA: Insufficient documentation

## 2015-02-09 DIAGNOSIS — Z8719 Personal history of other diseases of the digestive system: Secondary | ICD-10-CM | POA: Insufficient documentation

## 2015-02-09 DIAGNOSIS — Z8701 Personal history of pneumonia (recurrent): Secondary | ICD-10-CM | POA: Diagnosis not present

## 2015-02-09 LAB — URINALYSIS, ROUTINE W REFLEX MICROSCOPIC
Bilirubin Urine: NEGATIVE
GLUCOSE, UA: NEGATIVE mg/dL
Hgb urine dipstick: NEGATIVE
Ketones, ur: NEGATIVE mg/dL
LEUKOCYTES UA: NEGATIVE
NITRITE: NEGATIVE
PH: 7 (ref 5.0–8.0)
PROTEIN: NEGATIVE mg/dL
Specific Gravity, Urine: 1.016 (ref 1.005–1.030)

## 2015-02-09 LAB — CBC
HEMATOCRIT: 42.6 % (ref 39.0–52.0)
Hemoglobin: 15 g/dL (ref 13.0–17.0)
MCH: 31.4 pg (ref 26.0–34.0)
MCHC: 35.2 g/dL (ref 30.0–36.0)
MCV: 89.3 fL (ref 78.0–100.0)
Platelets: 159 10*3/uL (ref 150–400)
RBC: 4.77 MIL/uL (ref 4.22–5.81)
RDW: 12.6 % (ref 11.5–15.5)
WBC: 7.6 10*3/uL (ref 4.0–10.5)

## 2015-02-09 LAB — CBG MONITORING, ED: Glucose-Capillary: 141 mg/dL — ABNORMAL HIGH (ref 65–99)

## 2015-02-09 LAB — BASIC METABOLIC PANEL
ANION GAP: 11 (ref 5–15)
BUN: 25 mg/dL — ABNORMAL HIGH (ref 6–20)
CHLORIDE: 105 mmol/L (ref 101–111)
CO2: 25 mmol/L (ref 22–32)
Calcium: 9.9 mg/dL (ref 8.9–10.3)
Creatinine, Ser: 1.33 mg/dL — ABNORMAL HIGH (ref 0.61–1.24)
GFR, EST NON AFRICAN AMERICAN: 52 mL/min — AB (ref >60)
Glucose, Bld: 135 mg/dL — ABNORMAL HIGH (ref 65–99)
POTASSIUM: 4 mmol/L (ref 3.5–5.1)
SODIUM: 141 mmol/L (ref 135–145)

## 2015-02-09 MED ORDER — AMOXICILLIN-POT CLAVULANATE 875-125 MG PO TABS: 1.0000 | ORAL_TABLET | Freq: Two times a day (BID) | ORAL | Status: DC

## 2015-02-09 MED ORDER — FLUTICASONE PROPIONATE 50 MCG/ACT NA SUSP: 2.0000 | Freq: Every day | NASAL | Status: DC

## 2015-02-09 NOTE — Discharge Instructions (Signed)
You have chronic sinusitis.  Start Augmentin, take for 10 days. You may need a longer course of antibiotics up to 1 month. Follow-up with your primary doctor to discuss this.  Start Flonase. Also recommend Nasal Saline over the counter spray to help flush nasal passages, help keep moist and avoid bleeding.  You may consider trying Zyrtec or Claritin as needed as well.

## 2015-02-09 NOTE — ED Provider Notes (Signed)
CSN: [id number]     Arrival date & time 02/09/15  1742 History   First MD Initiated Contact with Patient 02/09/15 2014     Chief Complaint  Patient presents with  . Dizziness  . Epistaxis   History provided by patient.  (Consider location/radiation/quality/duration/timing/severity/associated sxs/prior Treatment) Patient is a 70 y.o. male presenting with dizziness and nosebleeds.  Dizziness Associated symptoms: headaches (generalized)   Associated symptoms: no chest pain, no diarrhea, no hearing loss, no nausea, no shortness of breath, no vomiting and no weakness   Epistaxis Associated symptoms: dizziness (resolved) and headaches (generalized)   Associated symptoms: no congestion, no cough, no fever and no sore throat     Patient reports that he presented to ED given recurrent episode of brief nosebleed associated with constellation of symptoms including some dizziness, Right eye pain, ear pressure, and generalized headache. Last episode of similar brief dizziness with nosebleed yesterday 02/07/15 symptoms lasted < 1 minute then self resolved. No further symptoms. Today he felt fine and went to work, then today around 1700 he described a sudden onset repeat small nosebleed again < 1 min and he felt some dizziness without vertigo. He came to ED for further evaluation. Since arrival to ED his symptoms have improved, he no longer feels dizziness, tolerating ambulating from the car to ED without difficulty. But remains concerned with some headache, right eye pain, and ear fullness. Regarding his eye, seen 1 month ago diagnosed with chronic dry eyes, advised on moisturizer drops. Past history of nosebleed 4-5 years ago.  Recent history includes a chronic HTN recently established with PCP over past 4-6 weeks, previously has not been followed by any doctors regularly. His BPs used to be 140s/80s until recent doctor visits as high as 180s/100s, they started Amlodipine 2.5 and then titrated up to 5mg   nightly. He also had some dizziness in mornings, which as improved.  Past Medical History  Diagnosis Date  . GERD (gastroesophageal reflux disease)   . Pneumonia     in his teens  . Shingles   . Bronchitis     normal PFT 01/15/08  . Dysrhythmia   . Asthma     hx    Past Surgical History  Procedure Laterality Date  . Appendectomy  82  . Finger surgery Left 13    small finger  . Ingrown toenails Bilateral 2014    toenails removed from big toes  . Inguinal hernia repair Left 03/22/2014    Procedure: OPEN REPAIR LEFT INGUINAL HERNIA;  Surgeon: Fanny Skates, MD;  Location: [place];  Service: General;  Laterality: Left;  . Insertion of mesh Left 03/22/2014    Procedure: INSERTION OF MESH;  Surgeon: Fanny Skates, MD;  Location: [facility] OR;  Service: General;  Laterality: Left;   Family History  Problem Relation Age of Onset  . Asthma Son   . Heart attack Father   . Healthy Son    Social History  Substance Use Topics  . Smoking status: Former Smoker -- 0.20 packs/day for 10 years    Types: Cigarettes    Quit date: 02/10/1994  . Smokeless tobacco: None  . Alcohol Use: No    Review of Systems  Constitutional: Negative for fever, chills, diaphoresis, activity change, fatigue and unexpected weight change.  HENT: Positive for nosebleeds and sinus pressure. Negative for congestion, ear discharge, ear pain, facial swelling, hearing loss, postnasal drip, rhinorrhea and sore throat.   Eyes: Positive for pain (right) and itching. Negative for photophobia and redness.  Respiratory: Negative for cough, choking and shortness of breath.   Cardiovascular: Negative for chest pain and leg swelling.  Gastrointestinal: Negative for nausea, vomiting, abdominal pain and diarrhea.  Genitourinary: Negative for hematuria and difficulty urinating.  Musculoskeletal: Negative for arthralgias.  Skin: Negative for rash.  Neurological: Positive for dizziness (resolved) and headaches (generalized). Negative for  weakness and numbness.  Hematological: Does not bruise/bleed easily.      Allergies  Bacitracin-neomycin-polymyxin; Other; and Povidone iodine  Home Medications   Prior to Admission medications   Medication Sig Start Date End Date Taking? Authorizing Provider  amLODipine (NORVASC) 2.5 MG tablet Take 2.5 mg by mouth 2 (two) times daily.   Yes Historical Provider, MD  amoxicillin-clavulanate (AUGMENTIN) 875-125 MG tablet Take 1 tablet by mouth 2 (two) times daily. 02/09/15   Olin Hauser, DO  fluticasone (FLONASE) 50 MCG/ACT nasal spray Place 2 sprays into both nostrils daily. 02/09/15   Devonne Doughty Karamalegos, DO   BP 152/78 mmHg  Pulse 75  Temp(Src) 97.5 F (36.4 C) (Oral)  Resp 18  Ht 5\' 10"  (1.778 m)  Wt 74.844 kg  BMI 23.68 kg/m2  SpO2 98% Physical Exam  Constitutional: He is oriented to person, place, and time. He appears well-developed and well-nourished. No distress.  Well appearing 25 M  HENT:  Head: Normocephalic and atraumatic.  Right Ear: External ear normal.  Left Ear: External ear normal.  Mouth/Throat: Oropharynx is clear and moist. No oropharyngeal exudate.  Frontal and maxillary sinuses non-tender to palpation. No active epistaxis, rhinorrhea or purulence. Some old dried blood anteriorly bilateral nares. Oropharynx clear. Right TM mostly normal with some partially obstructing cerumen, Left TM normal without erythema, effusion or bulging  Eyes: Conjunctivae and EOM are normal. Pupils are equal, round, and reactive to light.  Neck: Normal range of motion. Neck supple. No thyromegaly present.  Cardiovascular: Normal rate, regular rhythm, normal heart sounds and intact distal pulses.   No murmur heard. Pulmonary/Chest: Effort normal and breath sounds normal. No respiratory distress. He has no wheezes. He has no rales.  Abdominal: Soft. Bowel sounds are normal. He exhibits no distension and no mass. There is no tenderness.  Musculoskeletal: Normal range of  motion. He exhibits no edema or tenderness.  Lymphadenopathy:    He has no cervical adenopathy.  Neurological: He is alert and oriented to person, place, and time.  Muscle str grip and ankles 5/5. Distal sensation to light touch intact.  Skin: Skin is warm and dry. No rash noted. He is not diaphoretic.  Psychiatric: He has a normal mood and affect. His behavior is normal.  Nursing note and vitals reviewed.   ED Course  Procedures (including critical care time) Labs Review Labs Reviewed  BASIC METABOLIC PANEL - Abnormal; Notable for the following:    Glucose, Bld 135 (*)    BUN 25 (*)    Creatinine, Ser 1.33 (*)    GFR calc non Af Amer 52 (*)    All other components within normal limits  CBG MONITORING, ED - Abnormal; Notable for the following:    Glucose-Capillary 141 (*)    All other components within normal limits  CBC  URINALYSIS, ROUTINE W REFLEX MICROSCOPIC (NOT AT [facility])    Imaging Review Ct Head Wo Contrast  02/09/2015  CLINICAL DATA:  Headache with dizziness.  Hypertensive emergency. EXAM: CT HEAD WITHOUT CONTRAST TECHNIQUE: Contiguous axial images were obtained from the base of the skull through the vertex without intravenous contrast. COMPARISON:  09/07/2009 FINDINGS:  Skull and Sinuses:No fracture or destructive process. Chronic sinusitis with mucosal thickening throughout paranasal sinuses. No acute sinusitis or mastoiditis. Prominent leftward nasal septal spurring. Visualized orbits: Negative. Brain: Unremarkable for age. No evidence of acute infarction, hemorrhage, hydrocephalus, or mass lesion/mass effect. IMPRESSION: 1. No acute finding or change since 2011. 2. Chronic sinusitis. Electronically Signed   By: Monte Fantasia M.D.   On: 02/09/2015 23:12   I have personally reviewed and evaluated these images and lab results as part of my medical decision-making.   EKG Interpretation   Date/Time:  Thursday February 09 2015 18:00:46 EST Ventricular Rate:  71 PR Interval:   158 QRS Duration: 90 QT Interval:  368 QTC Calculation: 399 R Axis:   79 Text Interpretation:  Normal sinus rhythm ST \\T \ T wave abnormality,  consider lateral ischemia Abnormal ECG When compared with ECG of  03/18/2014, T wave inversion in the Anterolateral leads is less prominent  Confirmed by [facility]  MD, DAVID ([id number]) on 02/09/2015 6:07:44 PM      MDM   Final diagnoses:  Chronic sinusitis, unspecified location  Chronic nonintractable headache, unspecified headache type  Epistaxis, recurrent   70 yr M with PMH HTN otherwise no significant prior history. Presents with recurrent brief epistaxis yesterday and again today, associated with some dizziness. Most of symptoms resolved in ED, no longer dizzy and epistaxis < 1 min, does have some persistent R-eye pain and generalized headache, BPs 150s/80s in ED, previously reported newly treated HTN over 4-6 weeks with BP 180/100 outpatient. Clinically well-appearing, no focal neuro deficits, eyes normal without focal findings and no loss of vision. Given constellation of symptoms consider patient may have chronic sinusitis, possible allergic component. Additionally, has seen ophtho 2 weeks ago dx with dry eye, reassuring for normal eye exam today.  UPDATE 2115 Patient remains concerned about symptoms with R-eye discomfort and the recurrent epistaxis, as discussed with Dr Audie Pinto, agree to proceed with Head CT wo contrast to rule out CVA or intracranial pathology, consider patient may have chronic sinusitis contributing to headaches or dizziness.  UPDATE 2330 Reviewed results of Head CT with chronic sinusitis paraspinal sinuses bilateral. Suspected cause of most of his symptoms. Start Augmentin 875/125 BID x 10 days and Flonase, also recommend zyrtec vs claritin trial, nasal saline to avoid epistaxis. Symptoms stable, ready for discharge. Follow-up with PCP within 1 week to discuss course of Augmentin, may need prolonged course.  Nobie Putnam, DO [place], PGY-3    Olin Hauser, DO 02/09/15 2356  Leonard Schwartz, MD 02/12/15 [phone]

## 2015-02-09 NOTE — ED Notes (Signed)
Patient verbalized understanding of discharge instructions and denies any further needs or questions at this time. VS stable. Patient ambulatory with steady gait.  

## 2015-02-09 NOTE — ED Notes (Signed)
Pt states yesterday afternoon he was driving home and started feeling dizzy and his nose started bleeding. Stated that it happened again today at 1700. States he has ear ear pressure right now but denies dizziness and headache. Nose is not currently bleeding.

## 2015-02-09 NOTE — ED Notes (Signed)
CBG - 141 ° °

## 2015-03-06 DIAGNOSIS — R9431 Abnormal electrocardiogram [ECG] [EKG]: Secondary | ICD-10-CM | POA: Insufficient documentation

## 2015-12-05 ENCOUNTER — Telehealth: Payer: Self-pay | Admitting: Internal Medicine

## 2015-12-05 NOTE — Telephone Encounter (Signed)
Called and spoke with pt and he stated that he has been having chest discomfort and chest congestion.  The discomfort has been moving around in his back and chest.  Pt denies any fever at this time.  He did state that he has a cough at times that is non productive.  He is wanting to see CY.  Last OV with CY was 02/10/14.  I advised the pt to seek emergency care if his CP gets worse this evening. He voiced his understanding.  CY please advise about an appt for this pt.  Thanks  No pending appts with CY.    Allergies  Allergen Reactions  . Bacitracin-Neomycin-Polymyxin   . Other     Adhesive tape  . Povidone Iodine Rash    Pt used betadine on toe over a period of about 1 week without problem.

## 2015-12-06 NOTE — Telephone Encounter (Signed)
Suggest he see an NP if available sooner than I have openings

## 2015-12-06 NOTE — Telephone Encounter (Signed)
I spoke with the pt and offered him multiple appt's with tammy parrett He states he does not know when exactly he can come in, so he will have to call back to make appt  He is aware he can see any NP ok per CDY

## 2015-12-19 DIAGNOSIS — N289 Disorder of kidney and ureter, unspecified: Secondary | ICD-10-CM | POA: Insufficient documentation

## 2017-02-28 ENCOUNTER — Emergency Department (HOSPITAL_COMMUNITY)
Admission: EM | Admit: 2017-02-28 | Discharge: 2017-03-01 | Disposition: A | Discharge status: Home / Self Care | Payer: Medicare Other | Attending: Emergency Medicine | Admitting: Emergency Medicine

## 2017-02-28 ENCOUNTER — Encounter (HOSPITAL_COMMUNITY): Payer: Self-pay | Admitting: Emergency Medicine

## 2017-02-28 ENCOUNTER — Emergency Department (HOSPITAL_COMMUNITY): Payer: Medicare Other

## 2017-02-28 DIAGNOSIS — J111 Influenza due to unidentified influenza virus with other respiratory manifestations: Secondary | ICD-10-CM | POA: Diagnosis not present

## 2017-02-28 DIAGNOSIS — I10 Essential (primary) hypertension: Secondary | ICD-10-CM | POA: Insufficient documentation

## 2017-02-28 DIAGNOSIS — Z79899 Other long term (current) drug therapy: Secondary | ICD-10-CM | POA: Diagnosis not present

## 2017-02-28 DIAGNOSIS — Z87891 Personal history of nicotine dependence: Secondary | ICD-10-CM | POA: Insufficient documentation

## 2017-02-28 DIAGNOSIS — J45909 Unspecified asthma, uncomplicated: Secondary | ICD-10-CM | POA: Insufficient documentation

## 2017-02-28 DIAGNOSIS — R69 Illness, unspecified: Secondary | ICD-10-CM

## 2017-02-28 DIAGNOSIS — R05 Cough: Secondary | ICD-10-CM | POA: Diagnosis present

## 2017-02-28 LAB — CBC
HEMATOCRIT: 44.6 % (ref 39.0–52.0)
HEMOGLOBIN: 15.2 g/dL (ref 13.0–17.0)
MCH: 31.1 pg (ref 26.0–34.0)
MCHC: 34.1 g/dL (ref 30.0–36.0)
MCV: 91.2 fL (ref 78.0–100.0)
Platelets: 138 10*3/uL — ABNORMAL LOW (ref 150–400)
RBC: 4.89 MIL/uL (ref 4.22–5.81)
RDW: 13.1 % (ref 11.5–15.5)
WBC: 9.2 10*3/uL (ref 4.0–10.5)

## 2017-02-28 LAB — BASIC METABOLIC PANEL
ANION GAP: 10 (ref 5–15)
BUN: 15 mg/dL (ref 6–20)
CHLORIDE: 104 mmol/L (ref 101–111)
CO2: 21 mmol/L — AB (ref 22–32)
Calcium: 9.4 mg/dL (ref 8.9–10.3)
Creatinine, Ser: 1.3 mg/dL — ABNORMAL HIGH (ref 0.61–1.24)
GFR calc Af Amer: >60 mL/min (ref >60)
GFR calc non Af Amer: 53 mL/min — ABNORMAL LOW (ref >60)
GLUCOSE: 133 mg/dL — AB (ref 65–99)
POTASSIUM: 3.9 mmol/L (ref 3.5–5.1)
Sodium: 135 mmol/L (ref 135–145)

## 2017-02-28 LAB — I-STAT TROPONIN, ED: TROPONIN I, POC: 0.03 ng/mL (ref 0.00–0.08)

## 2017-02-28 MED ORDER — OSELTAMIVIR PHOSPHATE 75 MG PO CAPS
75.0000 mg | ORAL_CAPSULE | Freq: Once | ORAL | Status: AC
  Administered 2017-03-01: 75 mg via ORAL
  Filled 2017-02-28: qty 1

## 2017-02-28 MED ORDER — ACETAMINOPHEN 500 MG PO TABS
1000.0000 mg | ORAL_TABLET | Freq: Once | ORAL | Status: AC
  Administered 2017-03-01: 1000 mg via ORAL
  Filled 2017-02-28: qty 2

## 2017-02-28 MED ORDER — MORPHINE SULFATE (PF) 4 MG/ML IV SOLN
2.0000 mg | Freq: Once | INTRAVENOUS | Status: DC
  Filled 2017-02-28: qty 1

## 2017-02-28 MED ORDER — SODIUM CHLORIDE 0.9 % IV BOLUS (SEPSIS)
1000.0000 mL | Freq: Once | INTRAVENOUS | Status: AC
  Administered 2017-03-01: 1000 mL via INTRAVENOUS

## 2017-02-28 NOTE — ED Triage Notes (Signed)
Per EMS pt with congestion and cold sx x 2 weeks.  Worsening today. "burning" sensation in chest with cough. Temp 101.3 on arrival.  Nausea but no vomiting. Poor po intake x 1 week.

## 2017-02-28 NOTE — ED Notes (Signed)
Patient transported to X-ray 

## 2017-02-28 NOTE — ED Provider Notes (Signed)
[place] EMERGENCY DEPARTMENT Provider Note   CSN: [id number] Arrival date & time: 02/28/17  2216     History   Chief Complaint Chief Complaint  Patient presents with  . URI  . Chest Pain    HPI Anthony Sanford is a 72 y.o. male.  Patient is a 72 year old male with past medical history of hypertension.  He presents today for evaluation of cough, burning in his chest, headache, fever, body aches.  This started abruptly earlier this morning.  He denies any ill contacts.  He does tell me that he got a flu shot this year in October.   The history is provided by the patient.  URI   This is a new problem. The maximum temperature recorded prior to his arrival was 101 to 101.9 F. The fever has been present for less than 1 day. Pertinent negatives include no diarrhea and no congestion. He has tried nothing for the symptoms.    Past Medical History:  Diagnosis Date  . Asthma    hx   . Bronchitis    normal PFT 01/15/08  . Dysrhythmia   . GERD (gastroesophageal reflux disease)   . Pneumonia    in his teens  . Shingles     Patient Active Problem List   Diagnosis Date Noted  . Incarcerated left inguinal hernia 03/22/2014  . Inguinal hernia, left 02/10/2014  . ACUTE ATOPIC CONJUNCTIVITIS 01/18/2010  . RHINOSINUSITIS, ACUTE 01/18/2010  . ASTHMA 04/11/2009  . GERD 03/06/2008  . CHEST PAIN 01/15/2008  . OTHER DISEASES OF LUNG NOT ELSEWHERE CLASSIFIED 11/12/2007  . HYPERTENSION 11/10/2007  . ASTHMATIC BRONCHITIS, ACUTE 11/10/2007    Past Surgical History:  Procedure Laterality Date  . APPENDECTOMY  80  . FINGER SURGERY Left 13   small finger  . ingrown toenails Bilateral 2014   toenails removed from big toes  . INGUINAL HERNIA REPAIR Left 03/22/2014   Procedure: OPEN REPAIR LEFT INGUINAL HERNIA;  Surgeon: Fanny Skates, MD;  Location: [place];  Service: General;  Laterality: Left;  . INSERTION OF MESH Left 03/22/2014   Procedure: INSERTION OF MESH;  Surgeon:  Fanny Skates, MD;  Location: [place];  Service: General;  Laterality: Left;       Home Medications    Prior to Admission medications   Medication Sig Start Date End Date Taking? Authorizing Provider  amLODipine (NORVASC) 2.5 MG tablet Take 2.5 mg by mouth 2 (two) times daily.    [provider]  amoxicillin-clavulanate (AUGMENTIN) 875-125 MG tablet Take 1 tablet by mouth 2 (two) times daily. 02/09/15   Karamalegos, Devonne Doughty, DO  fluticasone (FLONASE) 50 MCG/ACT nasal spray Place 2 sprays into both nostrils daily. 02/09/15   Olin Hauser, DO    Family History Family History  Problem Relation Age of Onset  . Heart attack Father   . Asthma Son   . Healthy Son     Social History Social History   Tobacco Use  . Smoking status: Former Smoker    Packs/day: 0.20    Years: 10.00    Pack years: 2.00    Types: Cigarettes    Last attempt to quit: 02/10/1994    Years since quitting: 23.0  Substance Use Topics  . Alcohol use: No    Alcohol/week: 0.0 oz  . Drug use: No     Allergies   Bacitracin-neomycin-polymyxin; Other; and Povidone iodine   Review of Systems Review of Systems  HENT: Negative for congestion.   Gastrointestinal:  Negative for diarrhea.  All other systems reviewed and are negative.    Physical Exam Updated Vital Signs BP (!) 182/83   Pulse 95   Temp (!) 101.3 F (38.5 C) (Oral)   Resp 13   SpO2 96%   Physical Exam  Constitutional: He is oriented to person, place, and time. He appears well-developed and well-nourished. No distress.  HENT:  Head: Normocephalic and atraumatic.  Mouth/Throat: Oropharynx is clear and moist.  Neck: Normal range of motion. Neck supple.  Cardiovascular: Normal rate and regular rhythm. Exam reveals no friction rub.  No murmur heard. Pulmonary/Chest: Effort normal and breath sounds normal. No respiratory distress. He has no wheezes. He has no rales.  Abdominal: Soft. Bowel sounds are normal. He exhibits  no distension. There is no tenderness.  Musculoskeletal: Normal range of motion. He exhibits no edema.  Neurological: He is alert and oriented to person, place, and time. Coordination normal.  Skin: Skin is warm and dry. He is not diaphoretic.  Nursing note and vitals reviewed.    ED Treatments / Results  Labs (all labs ordered are listed, but only abnormal results are displayed) Labs Reviewed  BASIC METABOLIC PANEL - Abnormal; Notable for the following components:      Result Value   CO2 21 (*)    Glucose, Bld 133 (*)    Creatinine, Ser 1.30 (*)    GFR calc non Af Amer 53 (*)    All other components within normal limits  CBC - Abnormal; Notable for the following components:   Platelets 138 (*)    All other components within normal limits  I-STAT TROPONIN, ED    EKG  EKG Interpretation  Date/Time:  Friday February 28 2017 22:24:03 EST Ventricular Rate:  93 PR Interval:    QRS Duration: 89 QT Interval:  344 QTC Calculation: 428 R Axis:   55 Text Interpretation:  Sinus rhythm Probable left atrial enlargement Repol abnrm suggests ischemia, anterolateral No significant change since last tracing Confirmed by Orlie Dakin [phone]) on 02/28/2017 10:27:44 PM       Radiology Dg Chest 2 View  Result Date: 02/28/2017 CLINICAL DATA:  Congestion and cold symptoms x2 weeks. EXAM: CHEST  2 VIEW COMPARISON:  03/24/2014 FINDINGS: Mild cardiac enlargement in part from the AP projection. Minimal aortic atherosclerosis is noted without aneurysm.No pulmonary consolidation. There is scarring at the left lung base. No effusion or pneumothorax. No acute nor suspicious osseous abnormalities. IMPRESSION: No active cardiopulmonary disease. Electronically Signed   By: Ashley Royalty M.D.   On: 02/28/2017 23:12    Procedures Procedures (including critical care time)  Medications Ordered in ED Medications  acetaminophen (TYLENOL) tablet 1,000 mg (not administered)  oseltamivir (TAMIFLU) capsule  75 mg (not administered)  sodium chloride 0.9 % bolus 1,000 mL (not administered)  morphine 4 MG/ML injection 2 mg (not administered)     Initial Impression / Assessment and Plan / ED Course  I have reviewed the triage vital signs and the nursing notes.  Pertinent labs & imaging results that were available during my care of the patient were reviewed by me and considered in my medical decision making (see chart for details).  Patient presenting here with complaints of fever, headache, burning in his chest, cough, and body aches.  His symptoms are most consistent with influenza.  His chest x-ray is clear and vital signs are stable with the exception of fever.  There is no hypoxia, tachycardia or tachypnea.  He will be treated with  Tamiflu, rotating Tylenol and ibuprofen, and follow-up as needed.  Final Clinical Impressions(s) / ED Diagnoses   Final diagnoses:  None    ED Discharge Orders    None       Veryl Speak, MD 03/01/17 [place] [place]

## 2017-03-01 MED ORDER — OSELTAMIVIR PHOSPHATE 75 MG PO CAPS: 75.0000 mg | ORAL_CAPSULE | Freq: Two times a day (BID) | ORAL | 0 refills | Status: DC

## 2017-03-01 NOTE — Discharge Instructions (Signed)
Tamiflu as prescribed.  Tylenol 1000 mg rotated with ibuprofen 600 mg every 4 hours as needed for pain or fever.  Get plenty of rest and drink plenty of fluids.  Return to the emergency department if you develop difficulty breathing, severe chest pain, or other new and concerning symptoms.

## 2017-11-20 DIAGNOSIS — N1831 Chronic kidney disease, stage 3a: Secondary | ICD-10-CM | POA: Insufficient documentation

## 2019-01-16 ENCOUNTER — Emergency Department (HOSPITAL_BASED_OUTPATIENT_CLINIC_OR_DEPARTMENT_OTHER): Payer: Medicare Other

## 2019-01-16 ENCOUNTER — Other Ambulatory Visit: Payer: Self-pay

## 2019-01-16 ENCOUNTER — Emergency Department (HOSPITAL_BASED_OUTPATIENT_CLINIC_OR_DEPARTMENT_OTHER)
Admission: EM | Admit: 2019-01-16 | Discharge: 2019-01-16 | Disposition: A | Discharge status: Home / Self Care | Payer: Medicare Other | Attending: Emergency Medicine | Admitting: Emergency Medicine

## 2019-01-16 ENCOUNTER — Encounter (HOSPITAL_BASED_OUTPATIENT_CLINIC_OR_DEPARTMENT_OTHER): Payer: Self-pay

## 2019-01-16 DIAGNOSIS — Z87891 Personal history of nicotine dependence: Secondary | ICD-10-CM | POA: Insufficient documentation

## 2019-01-16 DIAGNOSIS — Y9389 Activity, other specified: Secondary | ICD-10-CM | POA: Diagnosis not present

## 2019-01-16 DIAGNOSIS — J45909 Unspecified asthma, uncomplicated: Secondary | ICD-10-CM | POA: Insufficient documentation

## 2019-01-16 DIAGNOSIS — S8012XA Contusion of left lower leg, initial encounter: Secondary | ICD-10-CM

## 2019-01-16 DIAGNOSIS — W228XXA Striking against or struck by other objects, initial encounter: Secondary | ICD-10-CM | POA: Insufficient documentation

## 2019-01-16 DIAGNOSIS — S80922A Unspecified superficial injury of left lower leg, initial encounter: Secondary | ICD-10-CM | POA: Diagnosis present

## 2019-01-16 DIAGNOSIS — Y9259 Other trade areas as the place of occurrence of the external cause: Secondary | ICD-10-CM | POA: Insufficient documentation

## 2019-01-16 DIAGNOSIS — I10 Essential (primary) hypertension: Secondary | ICD-10-CM | POA: Insufficient documentation

## 2019-01-16 DIAGNOSIS — Z79899 Other long term (current) drug therapy: Secondary | ICD-10-CM | POA: Diagnosis not present

## 2019-01-16 DIAGNOSIS — Y990 Civilian activity done for income or pay: Secondary | ICD-10-CM | POA: Diagnosis not present

## 2019-01-16 MED ORDER — HYDROCODONE-ACETAMINOPHEN 5-325 MG PO TABS: 1.0000 | ORAL_TABLET | ORAL | 0 refills | Status: DC | PRN

## 2019-01-16 MED ORDER — IBUPROFEN 800 MG PO TABS: 800.0000 mg | ORAL_TABLET | Freq: Three times a day (TID) | ORAL | 0 refills | Status: DC | PRN

## 2019-01-16 NOTE — ED Provider Notes (Signed)
[facility] EMERGENCY DEPARTMENT Provider Note   CSN: [id number] Arrival date & time: 01/16/19  1730     History Chief Complaint  Patient presents with  . Leg Injury    Anthony Sanford is a 74 y.o. male.  Pt presents to the ED today with RLE pain.  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  Somehow, he managed to hit his right lower leg on a tire iron. He took ibuprofen 800 mg and came here.  He was able to drive, but used his left foot.  He has been able to walk.        Past Medical History:  Diagnosis Date  . Asthma    hx   . Bronchitis    normal PFT 01/15/08  . Dysrhythmia   . GERD (gastroesophageal reflux disease)   . Pneumonia    in his teens  . Shingles     Patient Active Problem List   Diagnosis Date Noted  . Incarcerated left inguinal hernia 03/22/2014  . Inguinal hernia, left 02/10/2014  . ACUTE ATOPIC CONJUNCTIVITIS 01/18/2010  . RHINOSINUSITIS, ACUTE 01/18/2010  . ASTHMA 04/11/2009  . GERD 03/06/2008  . CHEST PAIN 01/15/2008  . OTHER DISEASES OF LUNG NOT ELSEWHERE CLASSIFIED 11/12/2007  . HYPERTENSION 11/10/2007  . ASTHMATIC BRONCHITIS, ACUTE 11/10/2007    Past Surgical History:  Procedure Laterality Date  . APPENDECTOMY  80  . FINGER SURGERY Left 13   small finger  . ingrown toenails Bilateral 2014   toenails removed from big toes  . INGUINAL HERNIA REPAIR Left 03/22/2014   Procedure: OPEN REPAIR LEFT INGUINAL HERNIA;  Surgeon: Fanny Skates, MD;  Location: [place];  Service: General;  Laterality: Left;  . INSERTION OF MESH Left 03/22/2014   Procedure: INSERTION OF MESH;  Surgeon: Fanny Skates, MD;  Location: [facility];  Service: General;  Laterality: Left;       Family History  Problem Relation Age of Onset  . Heart attack Father   . Asthma Son   . Healthy Son     Social History   Tobacco Use  . Smoking status: Former Smoker    Packs/day: 0.20    Years: 10.00    Pack years: 2.00    Types: Cigarettes    Quit  date: 02/10/1994    Years since quitting: 24.9  . Smokeless tobacco: Never Used  Substance Use Topics  . Alcohol use: No    Alcohol/week: 0.0 standard drinks  . Drug use: No    Home Medications Prior to Admission medications   Medication Sig Start Date End Date Taking? Authorizing Provider  amoxicillin-clavulanate (AUGMENTIN) 875-125 MG tablet Take 1 tablet by mouth 2 (two) times daily. Patient not taking: Reported on 03/01/2017 02/09/15   Olin Hauser, DO  fluticasone [facility]) 50 MCG/ACT nasal spray Place 2 sprays into both nostrils daily. Patient not taking: Reported on 03/01/2017 02/09/15   Olin Hauser, DO  HYDROcodone-acetaminophen (NORCO/VICODIN) 5-325 MG tablet Take 1 tablet by mouth every 4 (four) hours as needed. 01/16/19   Isla Pence, MD  ibuprofen (ADVIL) 800 MG tablet Take 1 tablet (800 mg total) by mouth every 8 (eight) hours as needed for moderate pain. 01/16/19   Isla Pence, MD  oseltamivir (TAMIFLU) 75 MG capsule Take 1 capsule (75 mg total) by mouth every 12 (twelve) hours. 03/01/17   Veryl Speak, MD    Allergies    Bacitracin-neomycin-polymyxin, Other, and Povidone iodine  Review of Systems  Review of Systems  Musculoskeletal:       Right lower leg pain  All other systems reviewed and are negative.   Physical Exam Updated Vital Signs BP (!) 172/79 (BP Location: Right Arm)   Pulse 72   Temp 98.2 F (36.8 C) (Oral)   Resp 20   Ht 5\' 10"  (1.778 m)   Wt 79.4 kg   SpO2 98%   BMI 25.11 kg/m   Physical Exam Vitals and nursing note reviewed.  Constitutional:      Appearance: Normal appearance.  HENT:     Head: Normocephalic and atraumatic.     Right Ear: External ear normal.     Left Ear: External ear normal.     Nose: Nose normal.     Mouth/Throat:     Mouth: Mucous membranes are moist.     Pharynx: Oropharynx is clear.  Eyes:     Extraocular Movements: Extraocular movements intact.     Conjunctiva/sclera: Conjunctivae  normal.     Pupils: Pupils are equal, round, and reactive to light.  Cardiovascular:     Rate and Rhythm: Normal rate and regular rhythm.     Pulses: Normal pulses.     Heart sounds: Normal heart sounds.  Pulmonary:     Effort: Pulmonary effort is normal.     Breath sounds: Normal breath sounds.  Abdominal:     General: Abdomen is flat. Bowel sounds are normal.     Palpations: Abdomen is soft.  Musculoskeletal:     Cervical back: Normal range of motion and neck supple.     Comments: Hematoma to lateral, right mid-shin  Skin:    General: Skin is warm.     Capillary Refill: Capillary refill takes less than 2 seconds.  Neurological:     General: No focal deficit present.     Mental Status: He is alert and oriented to person, place, and time.  Psychiatric:        Mood and Affect: Mood normal.        Behavior: Behavior normal.     ED Results / Procedures / Treatments   Labs (all labs ordered are listed, but only abnormal results are displayed) Labs Reviewed - No data to display  EKG None  Radiology DG Tibia/Fibula Right  Result Date: 01/16/2019 CLINICAL DATA:  Injury to right leg.  Hit with object.  Pain. EXAM: RIGHT TIBIA AND FIBULA - 2 VIEW COMPARISON:  None. FINDINGS: Soft tissue swelling noted in the lower right calf laterally. No fracture, subluxation or dislocation. No radiopaque foreign body. IMPRESSION: No acute bony abnormality. Electronically Signed   By: Rolm Baptise M.D.   On: 01/16/2019 18:15    Procedures Procedures (including critical care time)  Medications Ordered in ED Medications - No data to display  ED Course  I have reviewed the triage vital signs and the nursing notes.  Pertinent labs & imaging results that were available during my care of the patient were reviewed by me and considered in my medical decision making (see chart for details).    MDM Rules/Calculators/A&P                      No fx.  Pt able to ambulate.  He is given ice and an ace  wrap.  Return if worse.  F/u with pcp.  Final Clinical Impression(s) / ED Diagnoses Final diagnoses:  Hematoma of leg, left, initial encounter    Rx / DC Orders ED Discharge Orders  Ordered    HYDROcodone-acetaminophen (NORCO/VICODIN) 5-325 MG tablet  Every 4 hours PRN     01/16/19 1828    ibuprofen (ADVIL) 800 MG tablet  Every 8 hours PRN     01/16/19 1828           Isla Pence, MD 01/16/19 1844

## 2019-01-16 NOTE — ED Notes (Signed)
Pt verbalized understanding of dc instructions.

## 2019-01-16 NOTE — ED Triage Notes (Signed)
Pt hit his R leg with a tire iron. Hematoma present.

## 2019-04-01 IMAGING — DX DG CHEST 2V
3 series · 3 of 3 positions shown · non-contrast
Comparison: 03/24/2014

CLINICAL DATA: Congestion and cold symptoms x2 weeks.

EXAM:
CHEST  2 VIEW

[chest ap (1 of 2)]
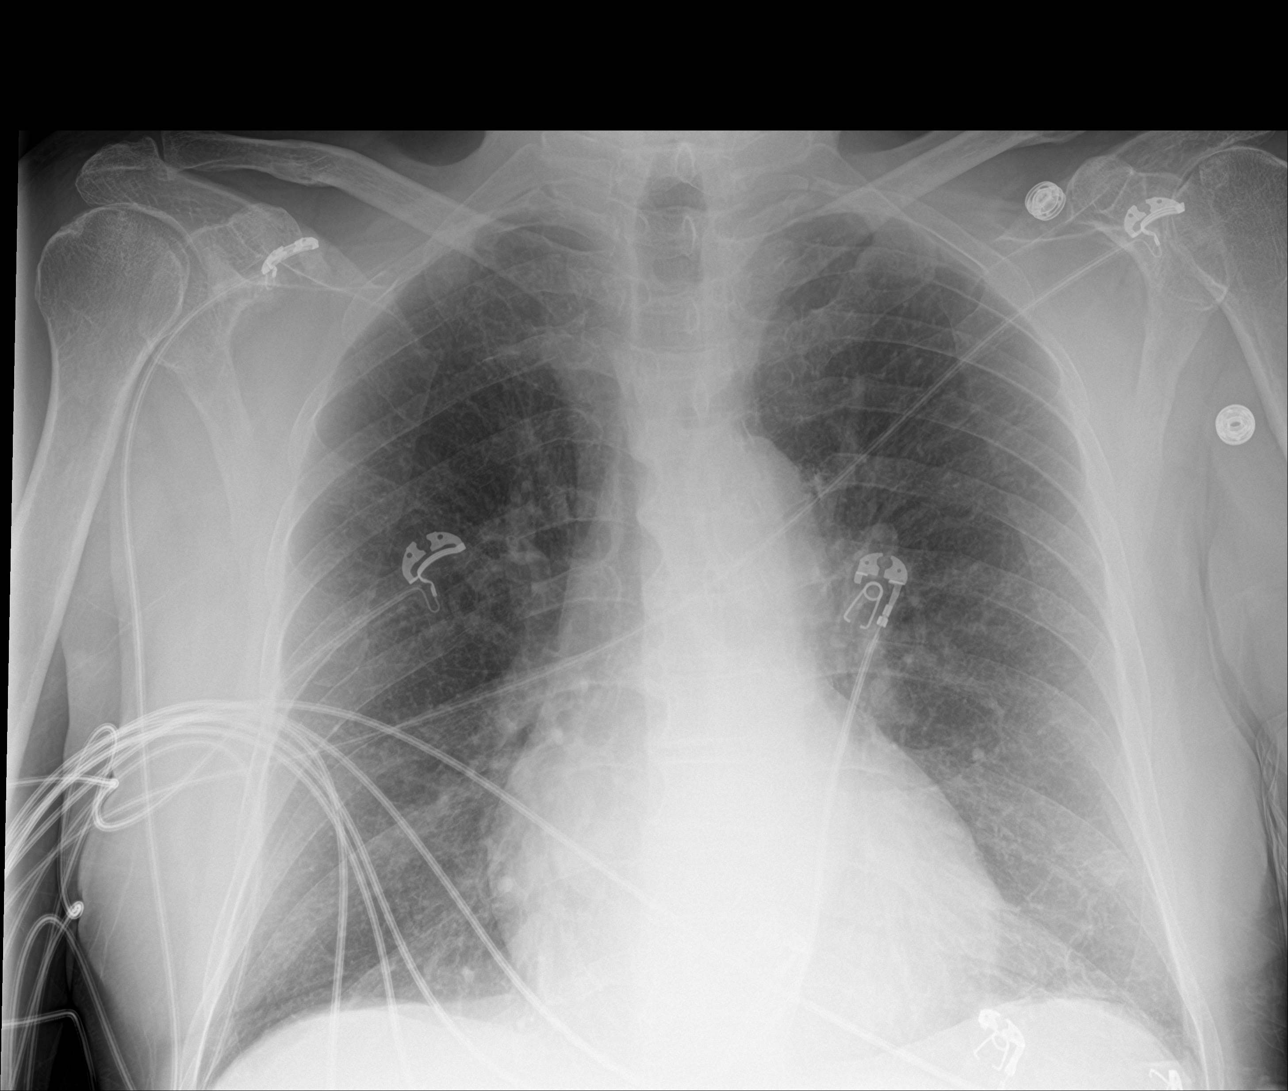

[chest lat]
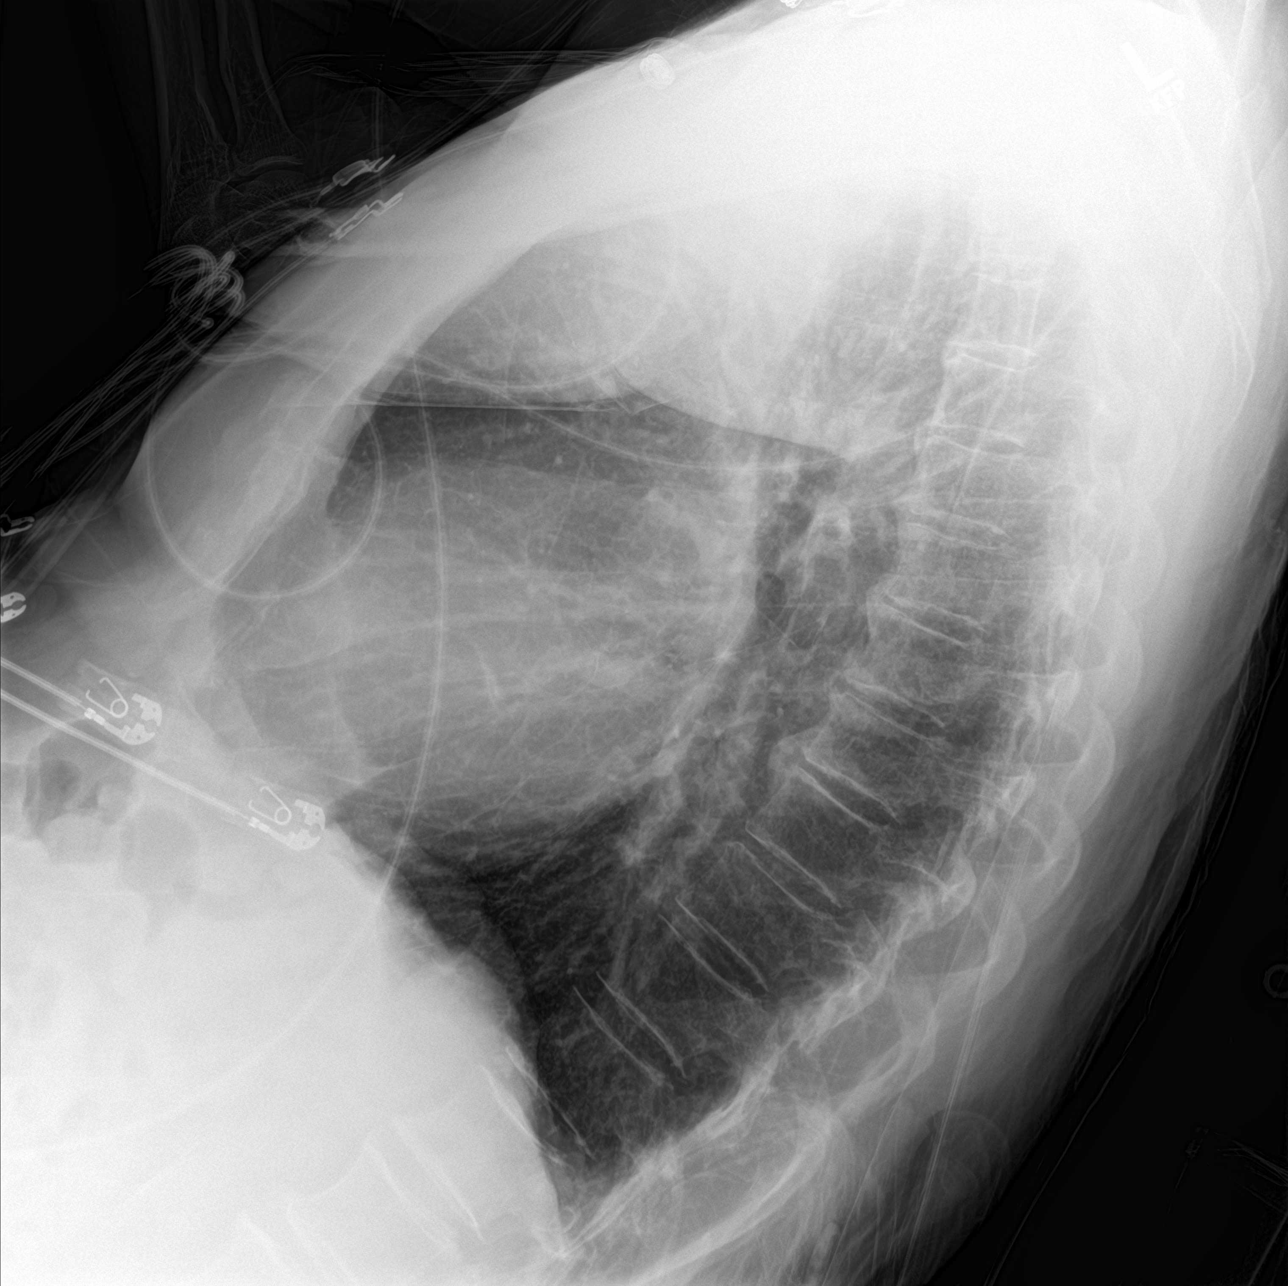

[chest ap (2 of 2)]
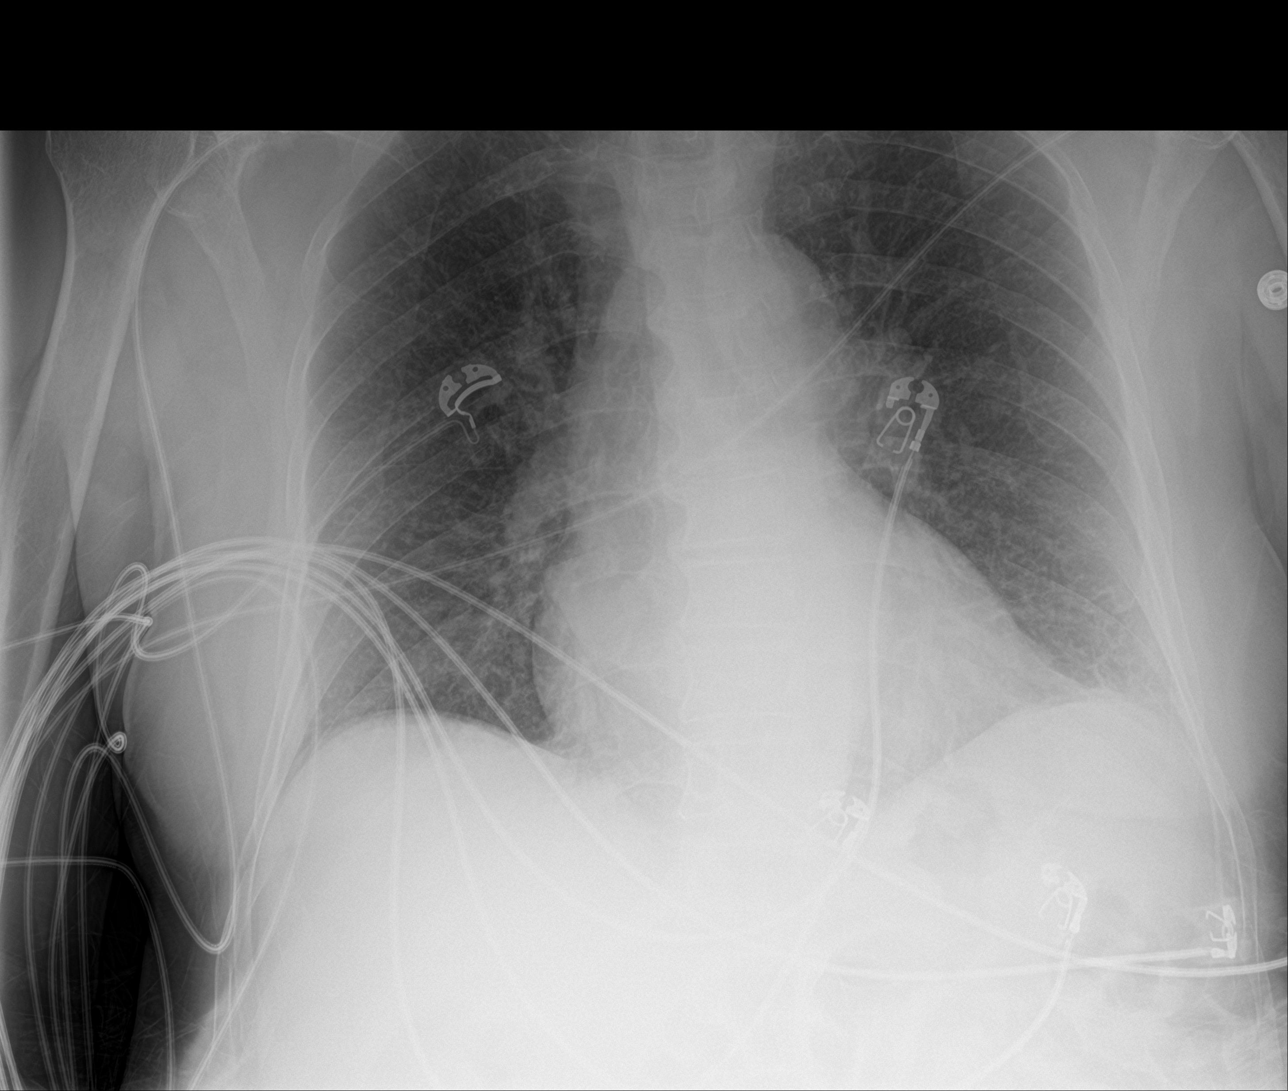

[3 of 3 positions shown; findings below may reference images not displayed]

FINDINGS: Mild cardiac enlargement in part from the AP projection. Minimal
aortic atherosclerosis is noted without aneurysm.No pulmonary
consolidation. There is scarring at the left lung base. No effusion
or pneumothorax. No acute nor suspicious osseous abnormalities.
IMPRESSION: No active cardiopulmonary disease.

## 2019-12-30 DIAGNOSIS — I422 Other hypertrophic cardiomyopathy: Secondary | ICD-10-CM | POA: Insufficient documentation

## 2021-01-19 ENCOUNTER — Encounter (HOSPITAL_BASED_OUTPATIENT_CLINIC_OR_DEPARTMENT_OTHER): Payer: Self-pay

## 2021-01-19 ENCOUNTER — Emergency Department (HOSPITAL_BASED_OUTPATIENT_CLINIC_OR_DEPARTMENT_OTHER): Payer: Medicare Other

## 2021-01-19 ENCOUNTER — Other Ambulatory Visit: Payer: Self-pay

## 2021-01-19 ENCOUNTER — Emergency Department (HOSPITAL_BASED_OUTPATIENT_CLINIC_OR_DEPARTMENT_OTHER)
Admission: EM | Admit: 2021-01-19 | Discharge: 2021-01-19 | Disposition: A | Discharge status: Home / Self Care | Payer: Medicare Other | Attending: Emergency Medicine | Admitting: Emergency Medicine

## 2021-01-19 DIAGNOSIS — R0789 Other chest pain: Secondary | ICD-10-CM

## 2021-01-19 DIAGNOSIS — Z20822 Contact with and (suspected) exposure to covid-19: Secondary | ICD-10-CM | POA: Diagnosis not present

## 2021-01-19 DIAGNOSIS — I10 Essential (primary) hypertension: Secondary | ICD-10-CM | POA: Insufficient documentation

## 2021-01-19 DIAGNOSIS — Z79899 Other long term (current) drug therapy: Secondary | ICD-10-CM | POA: Insufficient documentation

## 2021-01-19 DIAGNOSIS — R059 Cough, unspecified: Secondary | ICD-10-CM | POA: Diagnosis present

## 2021-01-19 DIAGNOSIS — J45909 Unspecified asthma, uncomplicated: Secondary | ICD-10-CM | POA: Diagnosis not present

## 2021-01-19 HISTORY — DX: Other hypertrophic cardiomyopathy: I42.2

## 2021-01-19 HISTORY — DX: Atherosclerotic heart disease of native coronary artery without angina pectoris: I25.10

## 2021-01-19 HISTORY — DX: Hyperlipidemia, unspecified: E78.5

## 2021-01-19 HISTORY — DX: Essential (primary) hypertension: I10

## 2021-01-19 LAB — BASIC METABOLIC PANEL
Anion gap: 7 (ref 5–15)
BUN: 26 mg/dL — ABNORMAL HIGH (ref 8–23)
CO2: 23 mmol/L (ref 22–32)
Calcium: 9.5 mg/dL (ref 8.9–10.3)
Chloride: 107 mmol/L (ref 98–111)
Creatinine, Ser: 1.48 mg/dL — ABNORMAL HIGH (ref 0.61–1.24)
GFR, Estimated: 48 mL/min — ABNORMAL LOW (ref >60)
Glucose, Bld: 103 mg/dL — ABNORMAL HIGH (ref 70–99)
Potassium: 4.7 mmol/L (ref 3.5–5.1)
Sodium: 137 mmol/L (ref 135–145)

## 2021-01-19 LAB — CBC
HCT: 45.6 % (ref 39.0–52.0)
Hemoglobin: 15.5 g/dL (ref 13.0–17.0)
MCH: 30.3 pg (ref 26.0–34.0)
MCHC: 34 g/dL (ref 30.0–36.0)
MCV: 89.1 fL (ref 80.0–100.0)
Platelets: 171 10*3/uL (ref 150–400)
RBC: 5.12 MIL/uL (ref 4.22–5.81)
RDW: 13 % (ref 11.5–15.5)
WBC: 9.5 10*3/uL (ref 4.0–10.5)
nRBC: 0 % (ref 0.0–0.2)

## 2021-01-19 LAB — TROPONIN I (HIGH SENSITIVITY): Troponin I (High Sensitivity): 15 ng/L (ref <18)

## 2021-01-19 LAB — RESP PANEL BY RT-PCR (FLU A&B, COVID) ARPGX2
Influenza A by PCR: NEGATIVE
Influenza B by PCR: NEGATIVE
SARS Coronavirus 2 by RT PCR: NEGATIVE

## 2021-01-19 MED ORDER — ALBUTEROL SULFATE HFA 108 (90 BASE) MCG/ACT IN AERS: 2.0000 | INHALATION_SPRAY | Freq: Four times a day (QID) | RESPIRATORY_TRACT | 2 refills | Status: DC | PRN

## 2021-01-19 MED ORDER — PREDNISONE 20 MG PO TABS: 20.0000 mg | ORAL_TABLET | Freq: Every day | ORAL | 0 refills | Status: AC

## 2021-01-19 MED ORDER — ALBUTEROL SULFATE (2.5 MG/3ML) 0.083% IN NEBU: 2.5000 mg | INHALATION_SOLUTION | RESPIRATORY_TRACT | 2 refills | Status: DC | PRN

## 2021-01-19 NOTE — ED Notes (Signed)
HFA albuterol instruction with aerochamber given for use with Rx at discharge.

## 2021-01-19 NOTE — ED Notes (Signed)
ED Provider at bedside. 

## 2021-01-19 NOTE — ED Notes (Signed)
Patient seen in triage, no SHOB noted, SpO2 100%, BBS clear & equal, speaking in complete sentences.  Not currently taking any meds for his asthma.

## 2021-01-19 NOTE — Discharge Instructions (Addendum)
Today you were evaluated for chest discomfort and respiratory concerns after experiencing a coughing episode while trying to sleep last night.  Fortunately your evaluation was not concerning for cardiac causes of discomfort and your chest x-ray was normal.  You were also negative for COVID-19 and influenza. I will send in a new inhaler for you to use for your wheezing and cough, as well as a short course of steroids to help with inflammation. I am hopeful that this combination will help you to start feeling better!  I recommend that you follow-up with your cardiologist and pulmonologist for continued care of your chronic conditions.  Please return for reevaluation if you have new trouble breathing, chest pain, weakness, fever, or unable to keep food or fluids down.

## 2021-01-19 NOTE — ED Provider Notes (Signed)
[facility] EMERGENCY DEPARTMENT Provider Note   CSN: [id number] Arrival date & time: 01/19/21  1242     History  Chief Complaint  Patient presents with   Cough   Chest Pain    Anthony Sanford is a 76 y.o. male with pertinent medical history of hypertrophic cardiomyopathy, hypercholesterolemia and hypertension who presents with a complaint of central burning chest pain.  The patient states that this has been bothering him for some time now, several months, and that last night he experiencing an episode of gurgling and discomfort while laying down with cough that he took Robitussin for.  Early during his evaluation he does state that he believes he has a pneumonia.  Patient also states that at a previous visit with his primary care physician he was told that he may have GERD given the quality of the pain he experiences (burning, bothersome when laying flat, associated with cough). He does not take any medication for indigestion.  On further review of the patient's chart it appears he was evaluated by cardiology in July 2022.  At that time he was having similar chest pain and has been recommended to have a stress test done however patient has not had this completed.    Home Medications Prior to Admission medications   Medication Sig Start Date End Date Taking? Authorizing Provider  albuterol (VENTOLIN HFA) 108 (90 Base) MCG/ACT inhaler Inhale 2 puffs into the lungs every 6 (six) hours as needed for wheezing or shortness of breath. 01/19/21  Yes Farrel Gordon, DO  predniSONE (DELTASONE) 20 MG tablet Take 1 tablet (20 mg total) by mouth daily with breakfast for 5 days. 01/19/21 01/24/21 Yes Farrel Gordon, DO  amLODipine (NORVASC) 2.5 MG tablet Take 2.5 mg by mouth daily. 01/04/21   [provider]  amoxicillin-clavulanate (AUGMENTIN) 875-125 MG tablet Take 1 tablet by mouth 2 (two) times daily. Patient not taking: Reported on 03/01/2017 02/09/15   Olin Hauser, DO   ibuprofen (ADVIL) 800 MG tablet Take 1 tablet (800 mg total) by mouth every 8 (eight) hours as needed for moderate pain. 01/16/19   Isla Pence, MD  metoprolol succinate (TOPROL-XL) 25 MG 24 hr tablet Take 25 mg by mouth daily. 11/01/20   [provider]  pravastatin (PRAVACHOL) 10 MG tablet Take 10 mg by mouth daily. 01/04/21   [provider]      Allergies    Bacitracin-neomycin-polymyxin, Other, and Povidone iodine    Review of Systems   Review of Systems  Constitutional:  Negative for activity change, chills, diaphoresis, fever and unexpected weight change.  HENT:  Negative for congestion.   Respiratory:  Positive for cough and wheezing. Negative for chest tightness and shortness of breath.   Cardiovascular:  Positive for chest pain. Negative for leg swelling.  Gastrointestinal:  Negative for abdominal pain, constipation, diarrhea, nausea and vomiting.  Genitourinary:  Negative for dysuria.  Musculoskeletal:  Positive for myalgias.  Neurological:  Negative for headaches.   Physical Exam Updated Vital Signs BP (!) 154/78    Pulse (!) 59    Temp 97.9 F (36.6 C) (Oral)    Resp 11    SpO2 100%  Constitutional: Elderly gentleman in no acute distress. Cardio: Regular rate and rhythm.  No murmurs, rubs, gallops. Pulm: Clear to auscultation bilaterally.  Normal work of breathing on room air. Abdomen: Soft, nontender, nondistended. MSK: Negative for extremity edema. Skin: Skin is warm and dry. Neuro: Alert and oriented x3.  No focal deficit noted.  Psych: Normal mood and affect.  ED Results / Procedures / Treatments   Labs (all labs ordered are listed, but only abnormal results are displayed) Labs Reviewed  BASIC METABOLIC PANEL - Abnormal; Notable for the following components:      Result Value   Glucose, Bld 103 (*)    BUN 26 (*)    Creatinine, Ser 1.48 (*)    GFR, Estimated 48 (*)    All other components within normal limits  RESP PANEL BY RT-PCR (FLU  A&B, COVID) ARPGX2  CBC  TROPONIN I (HIGH SENSITIVITY)  TROPONIN I (HIGH SENSITIVITY)    EKG EKG Interpretation  Date/Time:  Friday January 19 2021 12:55:36 EST Ventricular Rate:  61 PR Interval:  172 QRS Duration: 88 QT Interval:  404 QTC Calculation: 406 R Axis:   66 Text Interpretation: Normal sinus rhythm ST & T wave abnormality, consider inferolateral ischemia When compared with ECG of 28-Feb-2017 22:24, PREVIOUS ECG IS PRESENT No significant change since last tracing Confirmed by Blanchie Dessert [phone]) on 01/19/2021 1:54:12 PM  Radiology DG Chest Portable 1 View  Result Date: 01/19/2021 CLINICAL DATA:  Chest pain EXAM: PORTABLE CHEST 1 VIEW COMPARISON:  02/28/2017 FINDINGS: Left basilar scarring. No focal consolidation. No pleural effusion or pneumothorax. Heart and mediastinal contours are unremarkable. No acute osseous abnormality. IMPRESSION: No active disease. Electronically Signed   By: Kathreen Devoid M.D.   On: 01/19/2021 13:13    Procedures None  Medications Ordered in ED Medications - No data to display  ED Course/ Medical Decision Making/ A&P   Patient presents with the complaint of central chest pain and cough over the last several months.  He has been diagnosed with hypertrophic cardiomyopathy and was last seen by cardiology in July 2022.  EKG showed sinus rhythm and ST and T wave abnormalities that are not changed from last EKG in February 2019.  Chest x-ray was negative for active disease.  CBC was unremarkable.CMP with elevated serum creatinine to 1.48, however this is minimally changed from values and our system over the last several years.  Patient negative for COVID-19 and influenza.  Patient is hemodynamically stable and in no acute distress on exam.   1434: Troponin negative.  1506: On reevaluation patient seems to be most worried about asthma history and recurrence of breathing difficulties secondary to asthma as he does not have an inhaler at home that  has been recently used. He is agreeable to discharging home with a prescription for a new inhaler as well as a short course of steroids.  Patient is medically stable for discharge at this time.  Final Clinical Impression(s) / ED Diagnoses Final diagnoses:  Chest wall pain  Mild asthma without complication, unspecified whether persistent    Rx / DC Orders ED Discharge Orders          Ordered    albuterol (VENTOLIN HFA) 108 (90 Base) MCG/ACT inhaler  Every 6 hours PRN        01/19/21 1509    predniSONE (DELTASONE) 20 MG tablet  Daily with breakfast        01/19/21 Gar[place]chales, [place], DO 01/19/21 1513    Blanchie Dessert, MD 01/20/21 [phone]

## 2021-01-19 NOTE — ED Triage Notes (Signed)
Pt c/o flu like sx x 2-3 weeks-NAD-steady gait

## 2021-02-06 ENCOUNTER — Other Ambulatory Visit: Payer: Self-pay

## 2021-02-06 ENCOUNTER — Encounter: Payer: Self-pay | Admitting: Pulmonary Disease

## 2021-02-06 ENCOUNTER — Ambulatory Visit (INDEPENDENT_AMBULATORY_CARE_PROVIDER_SITE_OTHER): Payer: Medicare Other | Admitting: Pulmonary Disease

## 2021-02-06 VITALS — BP 128/72 | HR 68 | Temp 98.7°F | Ht 71.0 in | Wt 176.0 lb

## 2021-02-06 DIAGNOSIS — R072 Precordial pain: Secondary | ICD-10-CM

## 2021-02-06 DIAGNOSIS — J4530 Mild persistent asthma, uncomplicated: Secondary | ICD-10-CM | POA: Diagnosis not present

## 2021-02-06 DIAGNOSIS — R0609 Other forms of dyspnea: Secondary | ICD-10-CM | POA: Diagnosis not present

## 2021-02-06 MED ORDER — FLUTICASONE FUROATE-VILANTEROL 200-25 MCG/ACT IN AEPB: 1.0000 | INHALATION_SPRAY | Freq: Every day | RESPIRATORY_TRACT | 3 refills | Status: DC

## 2021-02-06 MED ORDER — DICLOFENAC SODIUM 1 % EX GEL: 1.0000 "application " | Freq: Two times a day (BID) | CUTANEOUS | 1 refills | Status: DC | PRN

## 2021-02-06 NOTE — Patient Instructions (Addendum)
Nice to meet you  I think the chest discomfort is most likely related to a muscle strain since it started after strenuous mowing with your push mower some many weeks ago.  I would hope this would have healed by now.  The fact that it is reproducible with change in position makes me think even more that this is related to the muscle.  I am glad it does not hurt with deep breathing.  Try applying Voltaren gel twice a day to the affected area to see if this helps.  Voltaren gel it is a medicine similar to ibuprofen but in a low concentration and only locally via the gel so you do not get systemic side effects.  It is possible the chest discomfort is related to asthma symptoms since you have dealt with asthma in the past or related  reflux or esophagus inflammation since symptoms of cough and wheezing worsen at night.  Fortunately, your chest x-ray is clear in the emergency room a couple weeks ago.  For the wheezing and cough, I recommend using a new inhaler called Breo.  This has an inhaled steroid and a long-acting version of a medicine like albuterol to help treat the symptoms.  In addition, keep your albuterol inhaler you received via the emergency room.  Use the albuterol 2 puffs up to every 4 hours as needed for wheezing or shortness of breath or cough.  My hope is that after starting the [facility] you will notice that your cough and wheezing are minimal and you will not need the albuterol.  But is good to have it in case you need it.  I see no discussion or mention of pulmonary hypertension in the notes from your cardiology or heart doctor.  The most recent heart ultrasound I can see from 2021 looks quite good in terms of pulmonary hypertension and the right side of the heart. I see no evidence of pulmonary hypertension.  Since there is some uncertainty, I recommend we repeat or obtain a heart ultrasound here so our heart doctors and myself can review.  If this looks good and there is no concern for pulmonary  hypertension on the heart ultrasound, I would not worry much more about it.  Lastly, it is possible some of the symptoms are related to the hypertrophic cardiomyopathy.  May need additional cardiology follow-up to help with this if we are unable to find solutions despite our efforts as above.  Return to clinic in 6 weeks or sooner as needed with Dr. Silas Flood

## 2021-02-14 ENCOUNTER — Ambulatory Visit (HOSPITAL_COMMUNITY): Payer: Medicare Other | Attending: Pulmonary Disease

## 2021-02-14 ENCOUNTER — Other Ambulatory Visit: Payer: Self-pay

## 2021-02-14 DIAGNOSIS — R0609 Other forms of dyspnea: Secondary | ICD-10-CM | POA: Diagnosis not present

## 2021-02-14 LAB — ECHOCARDIOGRAM COMPLETE
Area-P 1/2: 2.23 cm2
S' Lateral: 2.3 cm

## 2021-02-28 NOTE — Progress Notes (Incomplete)
@Patient  ID: Anthony Sanford, male    DOB: 05/26/1945, 76 y.o.   MRN: [id number]  Chief Complaint  Patient presents with   Consult    Consult. Pt states he has a lot of wheezing. Pt states that it gets worse at night when he lays down. Went to the ER and urgent care for wheezing. Pt states that the wheezing has been occurring for a few months. Pt states that his whole chest hurts all the time     Referring provider: Robyne Peers, MD  HPI:   PMH: Hypertrophic cardiomyopathy   Questionaires / Pulmonary Flowsheets:   ACT:  No flowsheet data found.  MMRC: No flowsheet data found.  Epworth:  No flowsheet data found.  Tests:   FENO:  No results found for: NITRICOXIDE  PFT: No flowsheet data found.  WALK:  No flowsheet data found.  Imaging: Personally reviewed and as per EMR discussion this note ECHOCARDIOGRAM COMPLETE  Result Date: 02/14/2021    ECHOCARDIOGRAM REPORT   Patient Name:   Anthony Sanford  Date of Exam: 02/14/2021 Medical Rec #:  [id number]     Height:       71.0 in Accession #:    [id number]    Weight:       176.0 lb Date of Birth:  December 17, 1945      BSA:          1.997 m Patient Age:    57 years      BP:           128/72 mmHg Patient Gender: M             HR:           68 bpm. Exam Location:  [place] Procedure: 2D Echo, Cardiac Doppler and Color Doppler Indications:    R06.09 SOB  History:        Patient has prior history of Echocardiogram examinations, most                 recent 10/26/2007. Signs/Symptoms:Shortness of Breath and                 Chronic lung disease; Risk Factors:Hypertension.  Sonographer:    Coralyn Helling RDCS Referring Phys: [place]  1. Left ventricular ejection fraction, by estimation, is 60 to 65%. The left ventricle has normal function. The left ventricle has no regional wall motion abnormalities. There is severe left ventricular hypertrophy. Left ventricular diastolic parameters  are consistent with Grade II  diastolic dysfunction (pseudonormalization).  2. Right ventricular systolic function is normal. The right ventricular size is normal. There is normal pulmonary artery systolic pressure. The estimated right ventricular systolic pressure is 77.9 mmHg.  3. Left atrial size was moderately dilated.  4. The mitral valve is normal in structure. Trivial mitral valve regurgitation. No evidence of mitral stenosis.  5. The aortic valve is tricuspid. Aortic valve regurgitation is not visualized. Aortic valve sclerosis/calcification is present, without any evidence of aortic stenosis.  6. The inferior vena cava is normal in size with greater than 50% respiratory variability, suggesting right atrial pressure of 3 mmHg. FINDINGS  Left Ventricle: Left ventricular ejection fraction, by estimation, is 60 to 65%. The left ventricle has normal function. The left ventricle has no regional wall motion abnormalities. The left ventricular internal cavity size was normal in size. There is  severe left ventricular hypertrophy. Left ventricular diastolic parameters are consistent with Grade II diastolic dysfunction (pseudonormalization). Right Ventricle:  The right ventricular size is normal. No increase in right ventricular wall thickness. Right ventricular systolic function is normal. There is normal pulmonary artery systolic pressure. The tricuspid regurgitant velocity is 2.78 m/s, and  with an assumed right atrial pressure of 3 mmHg, the estimated right ventricular systolic pressure is 01.0 mmHg. Left Atrium: Left atrial size was moderately dilated. Right Atrium: Right atrial size was normal in size. Pericardium: There is no evidence of pericardial effusion. Mitral Valve: The mitral valve is normal in structure. Trivial mitral valve regurgitation. No evidence of mitral valve stenosis. Tricuspid Valve: The tricuspid valve is normal in structure. Tricuspid valve regurgitation is trivial. Aortic Valve: The aortic valve is tricuspid. Aortic  valve regurgitation is not visualized. Aortic valve sclerosis/calcification is present, without any evidence of aortic stenosis. Pulmonic Valve: The pulmonic valve was normal in structure. Pulmonic valve regurgitation is not visualized. Aorta: The aortic root is normal in size and structure. Venous: The inferior vena cava is normal in size with greater than 50% respiratory variability, suggesting right atrial pressure of 3 mmHg. IAS/Shunts: No atrial level shunt detected by color flow Doppler.  LEFT VENTRICLE PLAX 2D LVIDd:         3.80 cm   Diastology LVIDs:         2.30 cm   LV e' medial:    7.72 cm/s LV PW:         1.40 cm   LV E/e' medial:  8.5 LV IVS:        2.10 cm   LV e' lateral:   9.03 cm/s LVOT diam:     1.70 cm   LV E/e' lateral: 7.3 LV SV:         51 LV SV Index:   26 LVOT Area:     2.27 cm  RIGHT VENTRICLE             IVC RV S prime:     17.60 cm/s  IVC diam: 1.10 cm TAPSE (M-mode): 1.6 cm RVSP:           33.9 mmHg LEFT ATRIUM             Index        RIGHT ATRIUM           Index LA diam:        4.30 cm 2.15 cm/m   RA Pressure: 3.00 mmHg LA Vol (A2C):   71.3 ml 35.70 ml/m  RA Area:     17.20 cm LA Vol (A4C):   94.3 ml 47.22 ml/m  RA Volume:   43.70 ml  21.88 ml/m LA Biplane Vol: 87.0 ml 43.56 ml/m  AORTIC VALVE LVOT Vmax:   113.00 cm/s LVOT Vmean:  71.600 cm/s LVOT VTI:    0.225 m  AORTA Ao Root diam: 3.10 cm Ao Asc diam:  3.40 cm MITRAL VALVE               TRICUSPID VALVE MV Area (PHT): 2.23 cm    TR Peak grad:   30.9 mmHg MV Decel Time: 340 msec    TR Vmax:        278.00 cm/s MV E velocity: 65.50 cm/s  Estimated RAP:  3.00 mmHg MV A velocity: 47.20 cm/s  RVSP:           33.9 mmHg MV E/A ratio:  1.39  SHUNTS                            Systemic VTI:  0.22 m                            Systemic Diam: 1.70 cm Dalton McleanMD Electronically signed by Franki Monte Signature Date/Time: 02/14/2021/6:07:07 PM    Final     Lab Results: Personally reviewed CBC    Component  Value Date/Time   WBC 9.5 01/19/2021 1358   RBC 5.12 01/19/2021 1358   HGB 15.5 01/19/2021 1358   HCT 45.6 01/19/2021 1358   PLT 171 01/19/2021 1358   MCV 89.1 01/19/2021 1358   MCH 30.3 01/19/2021 1358   MCHC 34.0 01/19/2021 1358   RDW 13.0 01/19/2021 1358   LYMPHSABS 3.3 03/18/2014 1000   MONOABS 0.4 03/18/2014 1000   EOSABS 0.4 03/18/2014 1000   BASOSABS 0.0 03/18/2014 1000    BMET    Component Value Date/Time   NA 137 01/19/2021 1358   K 4.7 01/19/2021 1358   CL 107 01/19/2021 1358   CO2 23 01/19/2021 1358   GLUCOSE 103 (H) 01/19/2021 1358   BUN 26 (H) 01/19/2021 1358   CREATININE 1.48 (H) 01/19/2021 1358   CALCIUM 9.5 01/19/2021 1358   GFRNONAA 48 (L) 01/19/2021 1358   GFRAA >60 02/28/2017 2235    BNP No results found for: BNP  ProBNP    Component Value Date/Time   PROBNP <30.0 10/23/2007 0840    Specialty Problems       Pulmonary Problems   ASTHMATIC BRONCHITIS, ACUTE    Mild intermittent       OTHER DISEASES OF LUNG NOT ELSEWHERE CLASSIFIED    Qualifier: Diagnosis of  By: Annamaria Boots MD, Clinton D       RHINOSINUSITIS, ACUTE    Qualifier: Diagnosis of  By: Annamaria Boots MD, Clinton D        Allergies  Allergen Reactions   Bacitracin-Neomycin-Polymyxin    Other     Adhesive tape   Povidone Iodine Rash    Pt used betadine on toe over a period of about 1 week without problem.    Immunization History  Administered Date(s) Administered   Influenza Split 09/26/2010, 10/07/2013   Influenza Whole 11/10/2007   Influenza, High Dose Seasonal PF 11/09/2014, 11/01/2017, 12/01/2019, 10/30/2020   Influenza,inj,Quad PF,6+ Mos 12/09/2015   Influenza,inj,quad, With Preservative 11/11/2013   Pneumococcal Conjugate-13 03/06/2015   Pneumococcal Polysaccharide-23 06/14/2008, 09/22/2018   Tdap 04/16/2011    Past Medical History:  Diagnosis Date   Asthma    hx    Bronchitis    normal PFT 01/15/08   Coronary artery disease    Dysrhythmia     GERD (gastroesophageal reflux disease)    Hyperlipidemia    Hypertension    Hypertrophic cardiomyopathy ([place])    Pneumonia    in his teens   Shingles     Tobacco History: Social History   Tobacco Use  Smoking Status Former   Packs/day: 0.20   Years: 10.00   Pack years: 2.00   Types: Cigarettes   Quit date: 02/10/1994   Years since quitting: 27.0  Smokeless Tobacco Never   Counseling given: Not Answered   Continue to not smoke  Outpatient Encounter Medications as of 02/06/2021  Medication Sig   albuterol (VENTOLIN HFA) 108 (90 Base) MCG/ACT inhaler Inhale 2 puffs into the lungs every 6 (  six) hours as needed for wheezing or shortness of breath.   amLODipine (NORVASC) 2.5 MG tablet Take 2.5 mg by mouth daily.   diclofenac Sodium (VOLTAREN) 1 % GEL Apply 1 application topically 2 (two) times daily as needed.   fluticasone furoate-vilanterol (BREO ELLIPTA) 200-25 MCG/ACT AEPB Inhale 1 puff into the lungs daily.   ibuprofen (ADVIL) 800 MG tablet Take 1 tablet (800 mg total) by mouth every 8 (eight) hours as needed for moderate pain.   metoprolol succinate (TOPROL-XL) 25 MG 24 hr tablet Take 25 mg by mouth daily.   pravastatin (PRAVACHOL) 10 MG tablet Take 10 mg by mouth daily.   [DISCONTINUED] amoxicillin-clavulanate (AUGMENTIN) 875-125 MG tablet Take 1 tablet by mouth 2 (two) times daily.   No facility-administered encounter medications on file as of 02/06/2021.     Review of Systems  Review of Systems  No chest pain with exertion.  No orthopnea or PND.  No syncope or presyncope.  No lower extremity swelling.  Comprehensive review of systems otherwise negative. Physical Exam  BP 128/72 (BP Location: Left Arm, Patient Position: Sitting, Cuff Size: Normal)    Pulse 68    Temp 98.7 F (37.1 C) (Oral)    Ht 5\' 11"  (1.803 m)    Wt 176 lb (79.8 kg)    SpO2 98%    BMI 24.55 kg/m   Wt Readings from Last 5 Encounters:  02/06/21 176 lb (79.8 kg)  01/16/19 175 lb  (79.4 kg)  02/09/15 165 lb (74.8 kg)  06/22/14 163 lb 5 oz (74.1 kg)  03/22/14 160 lb (72.6 kg)    BMI Readings from Last 5 Encounters:  02/06/21 24.55 kg/m  01/16/19 25.11 kg/m  02/09/15 23.68 kg/m  06/22/14 24.47 kg/m  03/22/14 22.96 kg/m     Physical Exam General: Well-appearing, no acute distress Eyes: EOMI, no icterus Neck: Supple, no JVP Pulmonary: Clear, no work of breathing Cardiovascular: Warm, regular rate and rhythm, Abdomen: Nondistended, bowel sounds present MSK: No synovitis, joint effusion Neuro: Normal gait, no weakness Psych: Normal mood, full affect   Assessment & Plan:   Chest comfort: Started after mowing the lawn.  Reproducible with changing positions.  Not pleuritic.  Not reliably reproducible with exertion.  Suspect MSK in nature.  Supportive care, Voltaren gel to the affected area  Asthma: With chest tightness, wheezing and cough.  History of asthma in the past.  Start/new prescription for high-dose Breo.  Albuterol as needed.  Albuterol does help symptoms.  Cough: Worse in the evenings and when lying down.  Suspect related to asthma.  Possibly related to reflux although he denies significant reflux episodes at this time.  Chest x-ray clear 01/19/2021.  Patient is very content about pulmonary hypertension.  I reviewed multiple cardiology notes and echocardiograms, there is no concern or mention of this in the notes.  Prior echocardiograms not consistent with pulmonary hypertension.  He does have hypertrophic cardiomyopathy.  I think most likely he is confusing conditions or words.  He is to follow-up with his cardiologist.  Repeat TTE pacific to evaluate the present of elevated right-sided pressures ordered today.  Return in about 6 weeks (around 03/20/2021).   Lanier Clam, MD 02/28/2021   This appointment required 90 minutes of patient care (this includes precharting, chart review, review of results, face-to-face care, etc.).

## 2021-03-20 ENCOUNTER — Ambulatory Visit: Payer: Medicare Other | Admitting: Pulmonary Disease

## 2021-09-03 ENCOUNTER — Other Ambulatory Visit: Payer: Medicare Other

## 2021-09-03 ENCOUNTER — Other Ambulatory Visit: Payer: Self-pay | Admitting: Nephrology

## 2021-09-03 ENCOUNTER — Ambulatory Visit
Admission: RE | Admit: 2021-09-03 | Discharge: 2021-09-03 | Disposition: A | Discharge status: Home / Self Care | Payer: Medicare Other | Source: Ambulatory Visit | Attending: Nephrology | Admitting: Nephrology

## 2021-09-03 DIAGNOSIS — N1832 Chronic kidney disease, stage 3b: Secondary | ICD-10-CM

## 2021-09-03 DIAGNOSIS — R809 Proteinuria, unspecified: Secondary | ICD-10-CM

## 2021-09-03 DIAGNOSIS — I129 Hypertensive chronic kidney disease with stage 1 through stage 4 chronic kidney disease, or unspecified chronic kidney disease: Secondary | ICD-10-CM

## 2021-09-03 DIAGNOSIS — N401 Enlarged prostate with lower urinary tract symptoms: Secondary | ICD-10-CM

## 2021-09-06 ENCOUNTER — Other Ambulatory Visit: Payer: Self-pay | Admitting: Nephrology

## 2021-09-06 DIAGNOSIS — N281 Cyst of kidney, acquired: Secondary | ICD-10-CM

## 2021-09-25 ENCOUNTER — Other Ambulatory Visit: Payer: Self-pay | Admitting: Nephrology

## 2021-09-25 DIAGNOSIS — N281 Cyst of kidney, acquired: Secondary | ICD-10-CM

## 2021-10-06 ENCOUNTER — Other Ambulatory Visit: Payer: Medicare Other

## 2021-10-06 ENCOUNTER — Inpatient Hospital Stay: Admission: RE | Admit: 2021-10-06 | Payer: Medicare Other | Source: Ambulatory Visit

## 2022-03-08 ENCOUNTER — Other Ambulatory Visit: Payer: Medicare Other

## 2022-03-08 ENCOUNTER — Ambulatory Visit
Admission: RE | Admit: 2022-03-08 | Discharge: 2022-03-08 | Disposition: A | Discharge status: Home / Self Care | Payer: Medicare Other | Source: Ambulatory Visit | Attending: Nephrology | Admitting: Nephrology

## 2022-03-08 DIAGNOSIS — N281 Cyst of kidney, acquired: Secondary | ICD-10-CM

## 2022-03-08 MED ORDER — GADOPICLENOL 0.5 MMOL/ML IV SOLN
8.0000 mL | Freq: Once | INTRAVENOUS | Status: AC | PRN
  Administered 2022-03-08: 8 mL via INTRAVENOUS

## 2022-06-26 DIAGNOSIS — M10072 Idiopathic gout, left ankle and foot: Secondary | ICD-10-CM | POA: Insufficient documentation

## 2022-07-13 ENCOUNTER — Emergency Department (HOSPITAL_BASED_OUTPATIENT_CLINIC_OR_DEPARTMENT_OTHER): Payer: Medicare Other

## 2022-07-13 ENCOUNTER — Emergency Department (HOSPITAL_BASED_OUTPATIENT_CLINIC_OR_DEPARTMENT_OTHER)
Admission: EM | Admit: 2022-07-13 | Discharge: 2022-07-13 | Disposition: A | Discharge status: Home / Self Care | Payer: Medicare Other | Attending: Emergency Medicine | Admitting: Emergency Medicine

## 2022-07-13 ENCOUNTER — Encounter (HOSPITAL_BASED_OUTPATIENT_CLINIC_OR_DEPARTMENT_OTHER): Payer: Self-pay

## 2022-07-13 DIAGNOSIS — I2510 Atherosclerotic heart disease of native coronary artery without angina pectoris: Secondary | ICD-10-CM | POA: Diagnosis not present

## 2022-07-13 DIAGNOSIS — N281 Cyst of kidney, acquired: Secondary | ICD-10-CM | POA: Insufficient documentation

## 2022-07-13 DIAGNOSIS — K5730 Diverticulosis of large intestine without perforation or abscess without bleeding: Secondary | ICD-10-CM | POA: Diagnosis not present

## 2022-07-13 DIAGNOSIS — I422 Other hypertrophic cardiomyopathy: Secondary | ICD-10-CM | POA: Insufficient documentation

## 2022-07-13 DIAGNOSIS — I129 Hypertensive chronic kidney disease with stage 1 through stage 4 chronic kidney disease, or unspecified chronic kidney disease: Secondary | ICD-10-CM | POA: Diagnosis not present

## 2022-07-13 DIAGNOSIS — I700 Atherosclerosis of aorta: Secondary | ICD-10-CM | POA: Insufficient documentation

## 2022-07-13 DIAGNOSIS — N189 Chronic kidney disease, unspecified: Secondary | ICD-10-CM | POA: Insufficient documentation

## 2022-07-13 DIAGNOSIS — E785 Hyperlipidemia, unspecified: Secondary | ICD-10-CM | POA: Diagnosis not present

## 2022-07-13 DIAGNOSIS — Z79899 Other long term (current) drug therapy: Secondary | ICD-10-CM | POA: Insufficient documentation

## 2022-07-13 DIAGNOSIS — Z1152 Encounter for screening for COVID-19: Secondary | ICD-10-CM | POA: Diagnosis not present

## 2022-07-13 DIAGNOSIS — R1084 Generalized abdominal pain: Secondary | ICD-10-CM | POA: Diagnosis not present

## 2022-07-13 DIAGNOSIS — J45909 Unspecified asthma, uncomplicated: Secondary | ICD-10-CM | POA: Insufficient documentation

## 2022-07-13 DIAGNOSIS — N400 Enlarged prostate without lower urinary tract symptoms: Secondary | ICD-10-CM | POA: Diagnosis not present

## 2022-07-13 DIAGNOSIS — F17210 Nicotine dependence, cigarettes, uncomplicated: Secondary | ICD-10-CM | POA: Insufficient documentation

## 2022-07-13 DIAGNOSIS — R531 Weakness: Secondary | ICD-10-CM

## 2022-07-13 LAB — COMPREHENSIVE METABOLIC PANEL
ALT: 16 U/L (ref 0–44)
AST: 16 U/L (ref 15–41)
Albumin: 3.8 g/dL (ref 3.5–5.0)
Alkaline Phosphatase: 67 U/L (ref 38–126)
Anion gap: 9 (ref 5–15)
BUN: 23 mg/dL (ref 8–23)
CO2: 22 mmol/L (ref 22–32)
Calcium: 8.7 mg/dL — ABNORMAL LOW (ref 8.9–10.3)
Chloride: 107 mmol/L (ref 98–111)
Creatinine, Ser: 1.4 mg/dL — ABNORMAL HIGH (ref 0.61–1.24)
GFR, Estimated: 51 mL/min — ABNORMAL LOW (ref >60)
Glucose, Bld: 114 mg/dL — ABNORMAL HIGH (ref 70–99)
Potassium: 3.9 mmol/L (ref 3.5–5.1)
Sodium: 138 mmol/L (ref 135–145)
Total Bilirubin: 0.9 mg/dL (ref 0.3–1.2)
Total Protein: 6.7 g/dL (ref 6.5–8.1)

## 2022-07-13 LAB — URINALYSIS, ROUTINE W REFLEX MICROSCOPIC
Bilirubin Urine: NEGATIVE
Glucose, UA: NEGATIVE mg/dL
Ketones, ur: NEGATIVE mg/dL
Leukocytes,Ua: NEGATIVE
Nitrite: NEGATIVE
Protein, ur: 100 mg/dL — AB
Specific Gravity, Urine: 1.02 (ref 1.005–1.030)
pH: 6.5 (ref 5.0–8.0)

## 2022-07-13 LAB — CBC WITH DIFFERENTIAL/PLATELET
Abs Immature Granulocytes: 0.02 10*3/uL (ref 0.00–0.07)
Basophils Absolute: 0 10*3/uL (ref 0.0–0.1)
Basophils Relative: 0 %
Eosinophils Absolute: 0.1 10*3/uL (ref 0.0–0.5)
Eosinophils Relative: 2 %
HCT: 45 % (ref 39.0–52.0)
Hemoglobin: 15.2 g/dL (ref 13.0–17.0)
Immature Granulocytes: 0 %
Lymphocytes Relative: 21 %
Lymphs Abs: 1.9 10*3/uL (ref 0.7–4.0)
MCH: 30.4 pg (ref 26.0–34.0)
MCHC: 33.8 g/dL (ref 30.0–36.0)
MCV: 90 fL (ref 80.0–100.0)
Monocytes Absolute: 0.5 10*3/uL (ref 0.1–1.0)
Monocytes Relative: 5 %
Neutro Abs: 6.4 10*3/uL (ref 1.7–7.7)
Neutrophils Relative %: 72 %
Platelets: 115 10*3/uL — ABNORMAL LOW (ref 150–400)
RBC: 5 MIL/uL (ref 4.22–5.81)
RDW: 12.9 % (ref 11.5–15.5)
WBC: 8.9 10*3/uL (ref 4.0–10.5)
nRBC: 0 % (ref 0.0–0.2)

## 2022-07-13 LAB — URINALYSIS, MICROSCOPIC (REFLEX)

## 2022-07-13 LAB — RESP PANEL BY RT-PCR (RSV, FLU A&B, COVID)  RVPGX2
Influenza A by PCR: NEGATIVE
Influenza B by PCR: NEGATIVE
Resp Syncytial Virus by PCR: NEGATIVE
SARS Coronavirus 2 by RT PCR: NEGATIVE

## 2022-07-13 LAB — CBG MONITORING, ED: Glucose-Capillary: 120 mg/dL — ABNORMAL HIGH (ref 70–99)

## 2022-07-13 LAB — TROPONIN I (HIGH SENSITIVITY)
Troponin I (High Sensitivity): 18 ng/L — ABNORMAL HIGH (ref <18)
Troponin I (High Sensitivity): 19 ng/L — ABNORMAL HIGH (ref <18)

## 2022-07-13 LAB — MAGNESIUM: Magnesium: 2.1 mg/dL (ref 1.7–2.4)

## 2022-07-13 MED ORDER — ONDANSETRON 4 MG PO TBDP: 4.0000 mg | ORAL_TABLET | Freq: Three times a day (TID) | ORAL | 0 refills | Status: DC | PRN

## 2022-07-13 MED ORDER — IOHEXOL 300 MG/ML  SOLN
100.0000 mL | Freq: Once | INTRAMUSCULAR | Status: AC | PRN
  Administered 2022-07-13: 100 mL via INTRAVENOUS

## 2022-07-13 NOTE — Discharge Instructions (Addendum)
Thank you for coming to [facility] Emergency Department. You were seen for abdominal pain, nausea/vomiting, episode of feeling like passing out. We did an exam, labs, and imaging, and these showed  no acute findings.  We have prescribed zofran to use under the tongue as needed every 6-8 hours for nausea vomiting.  Please stay well-hydrated at home.  Please check your blood pressure once daily in the afternoon and try to take your blood pressure medicine every day.  Please follow up with your primary care provider within 1 week.   Do not hesitate to return to the ED or call 911 if you experience: -Worsening symptoms -Nausea/vomiting so severe you cannot eat/drink anything -Lightheadedness, passing out -Fevers/chills -Anything else that concerns you

## 2022-07-13 NOTE — ED Notes (Signed)
Patient transported to CT 

## 2022-07-13 NOTE — ED Notes (Signed)
Pt able to stand and pivot x1 MIN assist from lowered EMS stretcher to locked and lowered ED Stretcher.

## 2022-07-13 NOTE — ED Triage Notes (Signed)
Per EMS, pt has had soft stool & generalized weakness for 5 weeks. Pt reports that today he got up ate a bowl of oatmeal, around lunch time he felt abdominal pain, fixed egg sandwich, took BP medication then vomited and became diaphoretic and dizzy.

## 2022-07-13 NOTE — ED Provider Notes (Signed)
[facility] Provider Note   CSN: [id number] Arrival date & time: 07/13/22  1750     History {Add pertinent medical, surgical, social history, OB history to HPI:1} Chief Complaint  Patient presents with   Weakness    Lazerick Schulze is a 77 y.o. male with *** presents with ***.   Per EMS, pt has had soft stool & generalized weakness for 5 weeks. Pt reports that today he got up ate a bowl of oatmeal, around lunch time he felt abdominal pain, fixed egg sandwich, took BP medication then vomited and became diaphoretic and dizzy.    Weakness      Home Medications Prior to Admission medications   Medication Sig Start Date End Date Taking? Authorizing Provider  albuterol (VENTOLIN HFA) 108 (90 Base) MCG/ACT inhaler Inhale 2 puffs into the lungs every 6 (six) hours as needed for wheezing or shortness of breath. 01/19/21   Champ Mungo, DO  amLODipine (NORVASC) 2.5 MG tablet Take 2.5 mg by mouth daily. 01/04/21   [provider]  diclofenac Sodium (VOLTAREN) 1 % GEL Apply 1 application topically 2 (two) times daily as needed. 02/06/21   Hunsucker, Lesia Sago, MD  fluticasone furoate-vilanterol (BREO ELLIPTA) 200-25 MCG/ACT AEPB Inhale 1 puff into the lungs daily. 02/06/21   Hunsucker, Lesia Sago, MD  ibuprofen (ADVIL) 800 MG tablet Take 1 tablet (800 mg total) by mouth every 8 (eight) hours as needed for moderate pain. 01/16/19   Jacalyn Lefevre, MD  metoprolol succinate (TOPROL-XL) 25 MG 24 hr tablet Take 25 mg by mouth daily. 11/01/20   [provider]  pravastatin (PRAVACHOL) 10 MG tablet Take 10 mg by mouth daily. 01/04/21   [provider]      Allergies    Bacitracin-neomycin-polymyxin, Other, and Povidone iodine    Review of Systems   Review of Systems  Neurological:  Positive for weakness.   A 10 point review of systems was performed and is negative unless otherwise reported in HPI.  Physical Exam Updated Vital  Signs BP (!) 176/72 (BP Location: Right Arm)   Pulse (!) 58   Temp 97.9 F (36.6 C) (Oral)   Resp (!) 24   Ht 5\' 11"  (1.803 m)   Wt 79.4 kg   SpO2 99%   BMI 24.41 kg/m  Physical Exam General: Normal appearing {Desc; male/male:11659}, lying in bed.  HEENT: PERRLA, Sclera anicteric, MMM, trachea midline.  Cardiology: RRR, no murmurs/rubs/gallops. BL radial and DP pulses equal bilaterally.  Resp: Normal respiratory rate and effort. CTAB, no wheezes, rhonchi, crackles.  Abd: Soft, non-tender, non-distended. No rebound tenderness or guarding.  GU: Deferred. MSK: No peripheral edema or signs of trauma. Extremities without deformity or TTP. No cyanosis or clubbing. Skin: warm, dry. No rashes or lesions. Back: No CVA tenderness Neuro: A&Ox4, CNs II-XII grossly intact. MAEs. Sensation grossly intact.  Psych: Normal mood and affect.   ED Results / Procedures / Treatments   Labs (all labs ordered are listed, but only abnormal results are displayed) Labs Reviewed - No data to display  EKG None  Radiology No results found.  Procedures Procedures  {Document cardiac monitor, telemetry assessment procedure when appropriate:1}  Medications Ordered in ED Medications - No data to display  ED Course/ Medical Decision Making/ A&P                          Medical Decision Making   This patient presents  to the ED for concern of ***, this involves an extensive number of treatment options, and is a complaint that carries with it a high risk of complications and morbidity.  I considered the following differential and admission for this acute, potentially life threatening condition.   MDM:    *** Ddx of acute altered mental status or encephalopathy considered but not limited to: -Intracranial abnormalities such as ICH, hydrocephalus, head trauma -Infection such as UTI, PNA, or meningitis -Toxic ingestion such as opioid overdose, anticholinergic toxicity, -Electrolyte abnormalities or  hyper/hypoglycemia -Hypercarbia or hypoxia -Hepatic encephalopathy or uremia -ACS or arrhythmia -Endocrine abnormality such as thyroid storm or myxedema coma   DDX for pre-syncope includes but is not limited to:  Consider anemia and electrolyte abnormalities as possible etiologies. Consider arrhythmias and ACS, although without associated symptoms, less likely. Consider hemorrhage vs CVA, although neuro intact now for 24 hours and no associated other symptoms. Consider mets given known underlying cancer. Consider PE as well, although without hypoxia or sob. Consider broad differential.  *** Given history, exam and workup, low suspicion for HF, ICH (no trauma, headache), seizure (no witnessed seizure like activity, no postictal period, tongue laceration, bladder incontinence), stroke (no focal neuro deficits), HOCM (no murmur, family history of sudden death), ACS (neg troponin, no anginal pain), aortic dissection (no chest pain), malignant arrhythmia on ekg or any family history of sudden death, or GI bleed (stable hgb). Low suspicion for PE given normal vital signs, absence of chest pain or dyspnea, no evidence of DVT, no recent surgery/immobilization. Based on canadian syncope rule, patient is low risk and well appearing here, plan to discharge the patient home with PMD follow up. ***   CARDIOVASCULAR Myocardial infarction (MI) Aortic stenosis (AS) Hypertrophic cardiomyopathy (HOCM) Pulmonary embolus (PE) Thoracic aortic dissection (TAD) Lethal dysrhythmia (VTACH) NEUROVASCULAR Subarachnoid hemorrhage  (SAH) TIA/Stroke BLEEDING Ruptured ectopic pregnancy Gastrointestinal bleeding (GIB) Ruptured abdominal aortic aneurysm (AAA) OTHER / BENIGN Everything else! Usually ok for outpatient follow-up      Labs: I Ordered, and personally interpreted labs.  The pertinent results include:  ***  Imaging Studies ordered: I ordered imaging studies including *** I independently visualized  and interpreted imaging. I agree with the radiologist interpretation  Additional history obtained from ***.  External records from outside source obtained and reviewed including ***  Cardiac Monitoring: The patient was maintained on a cardiac monitor.  I personally viewed and interpreted the cardiac monitored which showed an underlying rhythm of: ***  Reevaluation: After the interventions noted above, I reevaluated the patient and found that they have :{resolved/improved/worsened:23923::"improved"}  Social Determinants of Health: ***  Disposition:  ***  Co morbidities that complicate the patient evaluation  Past Medical History:  Diagnosis Date   Asthma    hx    Bronchitis    normal PFT 01/15/08   Coronary artery disease    Dysrhythmia    GERD (gastroesophageal reflux disease)    Hyperlipidemia    Hypertension    Hypertrophic cardiomyopathy ([facility])    Pneumonia    in his teens   Shingles      Medicines No orders of the defined types were placed in this encounter.   I have reviewed the patients home medicines and have made adjustments as needed  Problem List / ED Course: Problem List Items Addressed This Visit   None        {Document critical care time when appropriate:1} {Document review of labs and clinical decision tools ie heart score, Chads2Vasc2 etc:1}  {  Document your independent review of radiology images, and any outside records:1} {Document your discussion with family members, caretakers, and with consultants:1} {Document social determinants of health affecting pt's care:1} {Document your decision making why or why not admission, treatments were needed:1}  This note was created using dictation software, which may contain spelling or grammatical errors.

## 2023-06-04 ENCOUNTER — Emergency Department (HOSPITAL_BASED_OUTPATIENT_CLINIC_OR_DEPARTMENT_OTHER)

## 2023-06-04 ENCOUNTER — Encounter (HOSPITAL_BASED_OUTPATIENT_CLINIC_OR_DEPARTMENT_OTHER): Payer: Self-pay

## 2023-06-04 ENCOUNTER — Emergency Department (HOSPITAL_BASED_OUTPATIENT_CLINIC_OR_DEPARTMENT_OTHER)
Admission: EM | Admit: 2023-06-04 | Discharge: 2023-06-04 | Disposition: A | Discharge status: Home / Self Care | Attending: Emergency Medicine | Admitting: Emergency Medicine

## 2023-06-04 ENCOUNTER — Other Ambulatory Visit: Payer: Self-pay

## 2023-06-04 DIAGNOSIS — I2510 Atherosclerotic heart disease of native coronary artery without angina pectoris: Secondary | ICD-10-CM | POA: Diagnosis not present

## 2023-06-04 DIAGNOSIS — J45909 Unspecified asthma, uncomplicated: Secondary | ICD-10-CM | POA: Insufficient documentation

## 2023-06-04 DIAGNOSIS — Z79899 Other long term (current) drug therapy: Secondary | ICD-10-CM | POA: Insufficient documentation

## 2023-06-04 DIAGNOSIS — I129 Hypertensive chronic kidney disease with stage 1 through stage 4 chronic kidney disease, or unspecified chronic kidney disease: Secondary | ICD-10-CM | POA: Insufficient documentation

## 2023-06-04 DIAGNOSIS — N189 Chronic kidney disease, unspecified: Secondary | ICD-10-CM | POA: Insufficient documentation

## 2023-06-04 DIAGNOSIS — I10 Essential (primary) hypertension: Secondary | ICD-10-CM

## 2023-06-04 DIAGNOSIS — H5712 Ocular pain, left eye: Secondary | ICD-10-CM

## 2023-06-04 DIAGNOSIS — R519 Headache, unspecified: Secondary | ICD-10-CM

## 2023-06-04 LAB — CBC WITH DIFFERENTIAL/PLATELET
Abs Immature Granulocytes: 0.03 10*3/uL (ref 0.00–0.07)
Basophils Absolute: 0 10*3/uL (ref 0.0–0.1)
Basophils Relative: 0 %
Eosinophils Absolute: 0.3 10*3/uL (ref 0.0–0.5)
Eosinophils Relative: 4 %
HCT: 45.4 % (ref 39.0–52.0)
Hemoglobin: 15.3 g/dL (ref 13.0–17.0)
Immature Granulocytes: 0 %
Lymphocytes Relative: 47 %
Lymphs Abs: 3.1 10*3/uL (ref 0.7–4.0)
MCH: 30.8 pg (ref 26.0–34.0)
MCHC: 33.7 g/dL (ref 30.0–36.0)
MCV: 91.5 fL (ref 80.0–100.0)
Monocytes Absolute: 0.4 10*3/uL (ref 0.1–1.0)
Monocytes Relative: 6 %
Neutro Abs: 2.9 10*3/uL (ref 1.7–7.7)
Neutrophils Relative %: 43 %
Platelets: 149 10*3/uL — ABNORMAL LOW (ref 150–400)
RBC: 4.96 MIL/uL (ref 4.22–5.81)
RDW: 13.2 % (ref 11.5–15.5)
WBC: 6.7 10*3/uL (ref 4.0–10.5)
nRBC: 0 % (ref 0.0–0.2)

## 2023-06-04 LAB — COMPREHENSIVE METABOLIC PANEL WITH GFR
ALT: 18 U/L (ref 0–44)
AST: 20 U/L (ref 15–41)
Albumin: 4.5 g/dL (ref 3.5–5.0)
Alkaline Phosphatase: 74 U/L (ref 38–126)
Anion gap: 8 (ref 5–15)
BUN: 24 mg/dL — ABNORMAL HIGH (ref 8–23)
CO2: 26 mmol/L (ref 22–32)
Calcium: 9.6 mg/dL (ref 8.9–10.3)
Chloride: 106 mmol/L (ref 98–111)
Creatinine, Ser: 1.59 mg/dL — ABNORMAL HIGH (ref 0.61–1.24)
GFR, Estimated: 44 mL/min — ABNORMAL LOW (ref >60)
Glucose, Bld: 162 mg/dL — ABNORMAL HIGH (ref 70–99)
Potassium: 4.3 mmol/L (ref 3.5–5.1)
Sodium: 140 mmol/L (ref 135–145)
Total Bilirubin: 0.5 mg/dL (ref 0.0–1.2)
Total Protein: 6.4 g/dL — ABNORMAL LOW (ref 6.5–8.1)

## 2023-06-04 LAB — CBG MONITORING, ED: Glucose-Capillary: 182 mg/dL — ABNORMAL HIGH (ref 70–99)

## 2023-06-04 MED ORDER — TETRACAINE HCL 0.5 % OP SOLN
2.0000 [drp] | Freq: Once | OPHTHALMIC | Status: AC
  Administered 2023-06-04: 2 [drp] via OPHTHALMIC
  Filled 2023-06-04: qty 4

## 2023-06-04 MED ORDER — TETRACAINE HCL 0.5 % OP SOLN
2.0000 [drp] | Freq: Once | OPHTHALMIC | Status: DC
  Filled 2023-06-04: qty 4

## 2023-06-04 MED ORDER — IOHEXOL 350 MG/ML SOLN
100.0000 mL | Freq: Once | INTRAVENOUS | Status: AC | PRN
  Administered 2023-06-04: 60 mL via INTRAVENOUS

## 2023-06-04 MED ORDER — FLUORESCEIN SODIUM 1 MG OP STRP
1.0000 | ORAL_STRIP | Freq: Once | OPHTHALMIC | Status: AC
  Administered 2023-06-04: 1 via OPHTHALMIC
  Filled 2023-06-04: qty 1

## 2023-06-04 NOTE — Discharge Instructions (Addendum)
 You were seen in the emerged part for headache and left eye pain The MRI of your brain did not show any evidence of stroke Your eye exams were normal here This may be related to the yard work you recently did You can flush out your eyes with eyedrops as directed on the instructions You can take the metoprolol succinate 50mg  as prescribed by your doctor. Cardiology in October had recommended increasing your dose from 25mg  to 50mg  of metoprolol succinate per the telephone notes 11/01/2022.  Call the office of your eye doctor and schedule an appointment to be seen if symptoms persist Return to the emergency room for severe headache if you are unable to see or have any other concerns

## 2023-06-04 NOTE — ED Provider Notes (Signed)
  Physical Exam  BP (!) 154/86   Pulse 68   Temp (!) 97.5 F (36.4 C) (Tympanic)   Resp 14   SpO2 98%   Physical Exam Vitals and nursing note reviewed.  HENT:     Head: Normocephalic and atraumatic.  Eyes:     General: Lids are normal. Vision grossly intact. Gaze aligned appropriately.        Right eye: No foreign body.        Left eye: No foreign body.     Intraocular pressure: Right eye pressure is 16 mmHg. Left eye pressure is 17 mmHg. Measurements were taken using a handheld tonometer.    Pupils: Pupils are equal, round, and reactive to light.     Right eye: No corneal abrasion or fluorescein uptake.     Left eye: No corneal abrasion or fluorescein uptake.  Cardiovascular:     Rate and Rhythm: Normal rate and regular rhythm.  Pulmonary:     Effort: Pulmonary effort is normal.     Breath sounds: Normal breath sounds.  Abdominal:     Palpations: Abdomen is soft.     Tenderness: There is no abdominal tenderness.  Skin:    General: Skin is warm and dry.  Neurological:     Mental Status: He is alert.  Psychiatric:        Mood and Affect: Mood normal.     Procedures  Procedures  ED Course / MDM   Clinical Course as of 06/04/23 1812  Wed Jun 04, 2023  1810 MRI brain shows no acute intracranial findings.  Patient remains hypertensive here with blood pressure in 150s on my assessment.  Intraocular pressures within normal limits.  No corneal abrasions seen under fluorescein exam.  He will follow-up with his eye doctor [MP]    Clinical Course User Index [MP] Sallyanne Creamer, DO   Medical Decision Making I, Rafael Bun DO, have assumed care of this patient from the previous provider pending MRI brain reevaluation and disposition  Amount and/or Complexity of Data Reviewed Labs: ordered. Radiology: ordered.  Risk Prescription drug management.          Sallyanne Creamer, DO 06/04/23 1812

## 2023-06-04 NOTE — ED Triage Notes (Signed)
 States he had ear pain and eye pain yesterday, saw PCP last night. Hasn't taken antihypertensive in 5 days. Woke up this morning at 0400 with a sharp pain above left eye, lightheadedness and hypertension. LKW 2300.  Stroke screen negative.

## 2023-06-04 NOTE — ED Provider Notes (Signed)
 [place] [facility] AT [facility] Provider Note   CSN: [id number] Arrival date & time: 06/04/23  4098     History  Chief Complaint  Patient presents with   Hypertension   Headache    Anthony Sanford is a 78 y.o. male.  HPI     78 year old male with a history of coronary disease, hypertension, hyperlipidemia, hypertrophic cardiomyopathy, nonsustained VT, short runs of SVT on Holter monitor in 2024, CKD, elevated PSA, gout who presents with concern for headache.    He reports he has been out of his metoprolol for the past 5 days.  He was confused regarding his prescription that was written by his primary care doctor yesterday, was not sure if that is what he should be on.  He has been taking his amlodipine 2.5 mg.  He ran out of his metoprolol and then when he was given the new prescription he is not sure if it was correct and so he has not taken any metoprolol.  Previously had a 25mg  rx for metoprolol but not sure if he should have been taking 50  He reports he had been feeling somewhat off, having some pain in his leg.  He had some running eyes which he assumed was from allergies from mowing the grass.  He has had some right calf pain which is new for the past week.  He denies any chest pain or shortness of breath.  He reports he woke up with pain right above his left eye which was severe, like he was hit across the head.  He never had a pain like this before.  It woke him from sleep.  He denied on history having numbness, weakness, acute change in vision, difficulty talking or walking or talking.  He was afraid that he had had a stroke as a cause of his pain.  Denies any falls, trauma.  No fever.  No abdominal pain.  Per notes, cardiology had recommended increasing metoprolol succinate from 25 to 50 mg daily per telephone note November 01, 2022  Past Medical History:  Diagnosis Date   Asthma    hx    Bronchitis    normal PFT 01/15/08   Coronary artery  disease    Dysrhythmia    GERD (gastroesophageal reflux disease)    Hyperlipidemia    Hypertension    Hypertrophic cardiomyopathy (HCC)    Pneumonia    in his teens   Shingles     Home Medications Prior to Admission medications   Medication Sig Start Date End Date Taking? Authorizing Provider  allopurinol (ZYLOPRIM) 100 MG tablet Take 50 mg by mouth daily. 03/25/23  Yes [provider]  amLODipine (NORVASC) 2.5 MG tablet Take 2.5 mg by mouth daily. 01/04/21  Yes [provider]  doxycycline (VIBRAMYCIN) 100 MG capsule Take 100 mg by mouth daily. 04/25/23  Yes [provider]  metoprolol succinate (TOPROL-XL) 25 MG 24 hr tablet Take 25 mg by mouth daily. 11/01/20  Yes [provider]  pravastatin (PRAVACHOL) 10 MG tablet Take 10 mg by mouth at bedtime. 01/04/21  Yes [provider]  Vitamin D, Ergocalciferol, (DRISDOL) 1.25 MG (50000 UNIT) CAPS capsule Take 50,000 Units by mouth once a week. Sundays 03/25/23  Yes [provider]  metoprolol succinate (TOPROL-XL) 50 MG 24 hr tablet Take 50 mg by mouth daily. 06/03/23   [provider]      Allergies    Bacitracin-neomycin-polymyxin, Other, and Povidone iodine    Review  of Systems   Review of Systems  Physical Exam Updated Vital Signs BP (!) 154/86   Pulse 68   Temp (!) 97.5 F (36.4 C) (Tympanic)   Resp 14   SpO2 98%  Physical Exam Vitals and nursing note reviewed.  Constitutional:      General: He is not in acute distress.    Appearance: Normal appearance. He is well-developed. He is not ill-appearing or diaphoretic.  HENT:     Head: Normocephalic and atraumatic.  Eyes:     General: No visual field deficit.    Extraocular Movements: Extraocular movements intact.     Conjunctiva/sclera: Conjunctivae normal.     Pupils: Pupils are equal, round, and reactive to light.  Cardiovascular:     Rate and Rhythm: Normal rate and regular rhythm.     Pulses: Normal  pulses.     Heart sounds: Normal heart sounds. No murmur heard.    No friction rub. No gallop.  Pulmonary:     Effort: Pulmonary effort is normal. No respiratory distress.     Breath sounds: Normal breath sounds. No wheezing or rales.  Abdominal:     General: There is no distension.     Palpations: Abdomen is soft.     Tenderness: There is no abdominal tenderness. There is no guarding.  Musculoskeletal:        General: No swelling or tenderness.     Cervical back: Normal range of motion.  Skin:    General: Skin is warm and dry.     Findings: No erythema or rash.  Neurological:     General: No focal deficit present.     Mental Status: He is alert and oriented to person, place, and time.     GCS: GCS eye subscore is 4. GCS verbal subscore is 5. GCS motor subscore is 6.     Cranial Nerves: No cranial nerve deficit, dysarthria or facial asymmetry.     Sensory: Sensory deficit (reports on initial exam he feels less on the right) present.     Motor: No weakness (question mild weakness on right) or tremor.     Coordination: Coordination normal. Finger-Nose-Finger Test normal.     Gait: Gait normal.     ED Results / Procedures / Treatments   Labs (all labs ordered are listed, but only abnormal results are displayed) Labs Reviewed  CBC WITH DIFFERENTIAL/PLATELET - Abnormal; Notable for the following components:      Result Value   Platelets 149 (*)    All other components within normal limits  COMPREHENSIVE METABOLIC PANEL WITH GFR - Abnormal; Notable for the following components:   Glucose, Bld 162 (*)    BUN 24 (*)    Creatinine, Ser 1.59 (*)    Total Protein 6.4 (*)    GFR, Estimated 44 (*)    All other components within normal limits  CBG MONITORING, ED - Abnormal; Notable for the following components:   Glucose-Capillary 182 (*)    All other components within normal limits    EKG EKG Interpretation Date/Time:  Wednesday Jun 04 2023 08:08:51 EDT Ventricular Rate:  70 PR  Interval:  163 QRS Duration:  98 QT Interval:  403 QTC Calculation: 435 R Axis:   61  Text Interpretation: Sinus rhythm Consider left atrial enlargement Repol abnrm suggests ischemia, anterolateral Since prior ECGE, similar but more prominent lateral ST changes Confirmed by Scarlette Currier ([id number]) on 06/04/2023 10:06:31 AM  Radiology MR BRAIN WO CONTRAST Result Date: 06/04/2023 CLINICAL DATA:  Neuro deficit, concern for stroke, right-sided numbness. Ear and eye pain since yesterday. EXAM: MRI HEAD WITHOUT CONTRAST TECHNIQUE: Multiplanar, multiecho pulse sequences of the brain and surrounding structures were obtained without intravenous contrast. COMPARISON:  Earlier same day CTA head and neck. FINDINGS: Brain: No acute infarct. No evidence of intracranial hemorrhage. T2/FLAIR hyperintensity in the periventricular and subcortical white matter. Mild parenchymal volume loss. No edema, mass effect, or midline shift. Posterior fossa is unremarkable. Normal appearance of midline structures. The basilar cisterns are patent. No extra-axial fluid collections. Ventricles: Normal size and configuration of the ventricles. Vascular: Skull base flow voids are visualized. Skull and upper cervical spine: No focal abnormality. Sinuses/Orbits: Orbits are symmetric. Mild mucosal thickening in the frontal and ethmoid sinuses. Other: Mastoid air cells are clear. IMPRESSION: No acute intracranial abnormality. Mild chronic microvascular ischemic changes. Mild generalized parenchymal volume loss. Electronically Signed   By: Denny Flack M.D.   On: 06/04/2023 17:23   US  Venous Img Lower Right (DVT Study) Result Date: 06/04/2023 CLINICAL DATA:  RIGHT calf pain for several days EXAM: RIGHT LOWER EXTREMITY VENOUS DOPPLER ULTRASOUND TECHNIQUE: Gray-scale sonography with compression, as well as color and duplex ultrasound, were performed to evaluate the deep venous system(s) from the level of the common femoral vein through the  popliteal and proximal calf veins. COMPARISON:  None available FINDINGS: VENOUS Normal compressibility of the common femoral, superficial femoral, and popliteal veins, as well as the visualized calf veins. Visualized portions of profunda femoral vein and great saphenous vein unremarkable. No filling defects to suggest DVT on grayscale or color Doppler imaging. Doppler waveforms show normal direction of venous flow, normal respiratory plasticity and response to augmentation. Limited views of the contralateral common femoral vein are unremarkable. OTHER None. Limitations: none IMPRESSION: No right lower extremity DVT. Electronically Signed   By: Elester Grim M.D.   On: 06/04/2023 12:37   CT ANGIO HEAD NECK W WO CM Result Date: 06/04/2023 CLINICAL DATA:  78 year old male with acute left headache, pain above the left eye. Hypertensive. EXAM: CT ANGIOGRAPHY HEAD AND NECK WITH AND WITHOUT CONTRAST TECHNIQUE: Multidetector CT imaging of the head and neck was performed using the standard protocol during bolus administration of intravenous contrast. Multiplanar CT image reconstructions and MIPs were obtained to evaluate the vascular anatomy. Carotid stenosis measurements (when applicable) are obtained utilizing NASCET criteria, using the distal internal carotid diameter as the denominator. RADIATION DOSE REDUCTION: This exam was performed according to the departmental dose-optimization program which includes automated exposure control, adjustment of the mA and/or kV according to patient size and/or use of iterative reconstruction technique. CONTRAST:  60mL OMNIPAQUE  IOHEXOL  350 MG/ML SOLN COMPARISON:  Head CT 02/09/2015. FINDINGS: CT HEAD Brain: No midline shift, ventriculomegaly, mass effect, evidence of mass lesion, intracranial hemorrhage or evidence of cortically based acute infarction. Gray-white matter differentiation is within normal limits throughout the brain. Stable cerebral volume since 2017. Calvarium and  skull base: Stable and intact. No acute osseous abnormality identified. Paranasal sinuses: Visualized paranasal sinuses and mastoids are stable and well aerated. Orbits: Visualized orbits and scalp soft tissues are within normal limits. CTA NECK Skeleton: No acute osseous abnormality identified. Mild for age cervical spine degeneration. Upper chest: Mild upper lobe lung scarring is visible. Negative visible superior mediastinum. Other neck: Nonvascular neck soft tissue spaces are within normal limits aside from a circumscribed and benign 2.5 cm lipoma of the left trapezius on series 13, image 79. Aortic arch: Calcified aortic atherosclerosis.  3 vessel arch. Right carotid  system: Mild brachiocephalic artery and right CCA plaque without stenosis. Right carotid bifurcation plaque more so affects the proximal ECA. Mildly tortuous right ICA without stenosis to the skull base. Left carotid system: Similar mild plaque and tortuosity. No stenosis. Vertebral arteries: Mild proximal right subclavian plaque without stenosis. Normal right vertebral artery origin. Right vertebral artery is patent with mild tortuosity to the skull base, no stenosis. Proximal left subclavian artery mild soft and calcified plaque without stenosis. Normal left vertebral artery origin. Codominant left vertebral artery is patent with no significant plaque or stenosis to the skull base. CTA HEAD Posterior circulation: Distal vertebral arteries and vertebrobasilar junction are patent without stenosis. Normal left PICA origin. Right AICA appears to be dominant and patent. Patent basilar artery with no significant plaque or stenosis. Mildly ectatic basilar tip is patent. SCA and PCA origins are patent. Posterior communicating arteries are diminutive or absent. Mild P1 segment tortuosity. Bilateral PCA branches are patent. There is mild left P1 and right P2 segment irregularity and stenosis. Anterior circulation: Both ICA siphons are patent. No  significant siphon plaque or stenosis. The right siphon appears somewhat dominant. Ophthalmic artery origins appear normal. Patent carotid termini. Patent MCA and ACA origins. Dominant right and diminutive left ACA A1 segments. Normal anterior communicating artery. Bilateral ACA branches are within normal limits. Right MCA M1 segment is tortuous to the bifurcation which is patent without stenosis. Right MCA branches are tortuous without stenosis. Left MCA M1 segment is tortuous with mild to moderate irregularity and stenosis on series 20, image 53 and series 21, image 30. Left MCA bifurcation is patent without stenosis. Left MCA branches are within normal limits. Venous sinuses: Early contrast timing, grossly patent. Anatomic variants: Dominant right ACA and diminutive left ACA A1 segments. Review of the MIP images confirms the above findings IMPRESSION: 1. Negative for large vessel occlusion. 2. Generalized arterial tortuosity in the head and neck. Mild extracranial atherosclerosis. Mild bilateral PCA and mild to moderate left MCA M1 segment irregularity and stenosis. 3. No acute intracranial abnormality by CT. 4.  Aortic Atherosclerosis (ICD10-I70.0). Electronically Signed   By: Marlise Simpers M.D.   On: 06/04/2023 10:49    Procedures Procedures    Medications Ordered in ED Medications  iohexol  (OMNIPAQUE ) 350 MG/ML injection 100 mL (60 mLs Intravenous Contrast Given 06/04/23 1006)  tetracaine (PONTOCAINE) 0.5 % ophthalmic solution 2 drop (2 drops Both Eyes Given by Other 06/04/23 1751)  fluorescein ophthalmic strip 1 strip (1 strip Both Eyes Given by Other 06/04/23 1751)    ED Course/ Medical Decision Making/ A&P                                  78 year old male with a history of coronary disease, hypertension, hyperlipidemia, hypertrophic cardiomyopathy, nonsustained VT, short runs of SVT on Holter monitor in 2024, CKD, elevated PSA, gout who presents with concern for left-sided headache and found to  have right sided numbness.  He woke up this morning with headache, reports some feeling of being off over the last couple of days, is VAN negative, and given very mild deficits, with questionable weakness, right sided numbness on initial exam, suspicion that he is outside of the 4 and half hour window given that he woke up with symptoms, did not call a code stroke.  DDx includes CVA, TIA, ICH, hypertensive urgency, other headache. Lower suspicion at this time for GCA, CVT, acute angle closure glaucoma.  Labs completed in evaluate interpreted by me show no clinically significant electrolyte abnormalities, presence of CKD, no anemia or leukocytosis.  Given his right calf pain, DVT study was obtained which was negative for DVT.  CTA was completed which showed no evidence of intracranial hemorrhage, was negative for large vessel occlusion or aneurysm.  Has generalized arterial tortuosity bilateral PCA, mild to moderate left MCA M1 segment irregularity and stenosis.  Ordered an MRI given altered sensation on my initial exam.  On my reevaluation, he reports he thinks this was a misunderstanding, however given the way he described the symptoms initially, this finding, his concern during the exam for possible sensation of unilateral weakness, with evidence of stenosis of the left MCA, we will proceed with MRI to evaluate for signs of CVA.  If he does not have signs of stroke, can discuss blood pressure control, reinitiating his metoprolol 50 mg as per Cardiology recommendations and PCP follow up regarding headache symptoms. At this time headache has improved.        Final Clinical Impression(s) / ED Diagnoses Final diagnoses:  Acute nonintractable headache, unspecified headache type  Hypertension, unspecified type    Rx / DC Orders ED Discharge Orders     None         Scarlette Currier, MD 06/04/23 1806
